# Patient Record
Sex: Female | Born: 1965 | Race: White | Hispanic: No | Marital: Married | Smoking: Never smoker
Health system: Southern US, Community
[De-identification: ages and names within clinical notes are randomized; demographics above are authoritative.]

## PROBLEM LIST (undated history)

## (undated) DIAGNOSIS — I10 Essential (primary) hypertension: Secondary | ICD-10-CM

## (undated) DIAGNOSIS — K5900 Constipation, unspecified: Secondary | ICD-10-CM

## (undated) DIAGNOSIS — K7581 Nonalcoholic steatohepatitis (NASH): Secondary | ICD-10-CM

## (undated) DIAGNOSIS — F419 Anxiety disorder, unspecified: Secondary | ICD-10-CM

## (undated) DIAGNOSIS — E785 Hyperlipidemia, unspecified: Secondary | ICD-10-CM

## (undated) DIAGNOSIS — F32A Depression, unspecified: Secondary | ICD-10-CM

## (undated) DIAGNOSIS — F329 Major depressive disorder, single episode, unspecified: Secondary | ICD-10-CM

## (undated) DIAGNOSIS — G510 Bell's palsy: Secondary | ICD-10-CM

## (undated) HISTORY — DX: Essential (primary) hypertension: I10

## (undated) HISTORY — PX: ABDOMINAL SURGERY: SHX537

## (undated) HISTORY — PX: OTHER SURGICAL HISTORY: SHX169

## (undated) HISTORY — PX: CARPAL TUNNEL RELEASE: SHX101

## (undated) HISTORY — DX: Anxiety disorder, unspecified: F41.9

## (undated) HISTORY — PX: ABDOMINAL HYSTERECTOMY: SHX81

## (undated) HISTORY — DX: Depression, unspecified: F32.A

## (undated) HISTORY — DX: Hyperlipidemia, unspecified: E78.5

## (undated) HISTORY — DX: Nonalcoholic steatohepatitis (NASH): K75.81

## (undated) HISTORY — PX: APPENDECTOMY: SHX54

## (undated) HISTORY — DX: Constipation, unspecified: K59.00

## (undated) HISTORY — PX: COLONOSCOPY: SHX174

## (undated) HISTORY — DX: Bell's palsy: G51.0

## (undated) HISTORY — DX: Major depressive disorder, single episode, unspecified: F32.9

---

## 2000-02-15 ENCOUNTER — Encounter (INDEPENDENT_AMBULATORY_CARE_PROVIDER_SITE_OTHER): Payer: Self-pay | Admitting: Internal Medicine

## 2000-02-15 LAB — CONVERTED CEMR LAB: Pap Smear: NORMAL

## 2002-04-16 HISTORY — PX: ESOPHAGOGASTRODUODENOSCOPY: SHX1529

## 2003-01-04 LAB — HM COLONOSCOPY: HM Colonoscopy: NORMAL

## 2003-01-20 ENCOUNTER — Encounter: Admission: RE | Admit: 2003-01-20 | Discharge: 2003-01-20 | Payer: Self-pay | Admitting: Internal Medicine

## 2003-01-20 ENCOUNTER — Encounter: Payer: Self-pay | Admitting: Internal Medicine

## 2003-01-22 ENCOUNTER — Ambulatory Visit (HOSPITAL_COMMUNITY): Admission: RE | Admit: 2003-01-22 | Discharge: 2003-01-22 | Payer: Self-pay | Admitting: Internal Medicine

## 2003-01-22 ENCOUNTER — Encounter: Payer: Self-pay | Admitting: Internal Medicine

## 2003-01-27 ENCOUNTER — Ambulatory Visit (HOSPITAL_COMMUNITY): Admission: RE | Admit: 2003-01-27 | Discharge: 2003-01-27 | Payer: Self-pay | Admitting: Internal Medicine

## 2004-12-21 ENCOUNTER — Ambulatory Visit: Payer: Self-pay | Admitting: Family Medicine

## 2005-01-16 ENCOUNTER — Ambulatory Visit: Payer: Self-pay | Admitting: Family Medicine

## 2005-04-05 ENCOUNTER — Emergency Department: Payer: Self-pay | Admitting: Emergency Medicine

## 2005-07-06 ENCOUNTER — Ambulatory Visit: Payer: Self-pay | Admitting: Family Medicine

## 2006-01-17 ENCOUNTER — Ambulatory Visit: Payer: Self-pay | Admitting: Family Medicine

## 2006-01-30 ENCOUNTER — Ambulatory Visit: Payer: Self-pay | Admitting: Family Medicine

## 2006-02-21 ENCOUNTER — Ambulatory Visit: Payer: Self-pay | Admitting: Otolaryngology

## 2006-03-11 ENCOUNTER — Ambulatory Visit: Payer: Self-pay | Admitting: Family Medicine

## 2006-04-02 ENCOUNTER — Ambulatory Visit: Payer: Self-pay | Admitting: Internal Medicine

## 2006-04-10 ENCOUNTER — Ambulatory Visit: Payer: Self-pay | Admitting: Otolaryngology

## 2006-04-17 ENCOUNTER — Ambulatory Visit: Payer: Self-pay | Admitting: Family Medicine

## 2006-05-09 ENCOUNTER — Ambulatory Visit: Payer: Self-pay | Admitting: Internal Medicine

## 2006-06-19 ENCOUNTER — Ambulatory Visit: Payer: Self-pay | Admitting: Family Medicine

## 2006-07-16 ENCOUNTER — Ambulatory Visit: Payer: Self-pay | Admitting: Family Medicine

## 2006-07-16 DIAGNOSIS — D239 Other benign neoplasm of skin, unspecified: Secondary | ICD-10-CM

## 2006-07-16 HISTORY — DX: Other benign neoplasm of skin, unspecified: D23.9

## 2006-07-16 LAB — CONVERTED CEMR LAB: Potassium: 3.5 meq/L (ref 3.5–5.1)

## 2006-09-01 ENCOUNTER — Emergency Department: Payer: Self-pay | Admitting: Emergency Medicine

## 2006-11-12 ENCOUNTER — Encounter (INDEPENDENT_AMBULATORY_CARE_PROVIDER_SITE_OTHER): Payer: Self-pay | Admitting: Internal Medicine

## 2006-11-12 DIAGNOSIS — R945 Abnormal results of liver function studies: Secondary | ICD-10-CM | POA: Insufficient documentation

## 2006-11-12 DIAGNOSIS — K589 Irritable bowel syndrome without diarrhea: Secondary | ICD-10-CM | POA: Insufficient documentation

## 2006-11-12 DIAGNOSIS — E669 Obesity, unspecified: Secondary | ICD-10-CM | POA: Insufficient documentation

## 2006-11-12 DIAGNOSIS — G2581 Restless legs syndrome: Secondary | ICD-10-CM | POA: Insufficient documentation

## 2006-11-12 DIAGNOSIS — G4700 Insomnia, unspecified: Secondary | ICD-10-CM | POA: Insufficient documentation

## 2006-11-12 DIAGNOSIS — R319 Hematuria, unspecified: Secondary | ICD-10-CM | POA: Insufficient documentation

## 2006-11-12 DIAGNOSIS — F329 Major depressive disorder, single episode, unspecified: Secondary | ICD-10-CM | POA: Insufficient documentation

## 2006-11-12 DIAGNOSIS — G43109 Migraine with aura, not intractable, without status migrainosus: Secondary | ICD-10-CM | POA: Insufficient documentation

## 2006-11-21 ENCOUNTER — Ambulatory Visit: Payer: Self-pay | Admitting: Internal Medicine

## 2006-11-21 DIAGNOSIS — F411 Generalized anxiety disorder: Secondary | ICD-10-CM | POA: Insufficient documentation

## 2006-11-21 DIAGNOSIS — M542 Cervicalgia: Secondary | ICD-10-CM | POA: Insufficient documentation

## 2007-01-10 ENCOUNTER — Telehealth (INDEPENDENT_AMBULATORY_CARE_PROVIDER_SITE_OTHER): Payer: Self-pay | Admitting: Internal Medicine

## 2007-01-28 ENCOUNTER — Ambulatory Visit: Payer: Self-pay | Admitting: Family Medicine

## 2007-01-28 DIAGNOSIS — S335XXA Sprain of ligaments of lumbar spine, initial encounter: Secondary | ICD-10-CM | POA: Insufficient documentation

## 2008-02-25 ENCOUNTER — Emergency Department: Payer: Self-pay | Admitting: Emergency Medicine

## 2008-03-26 ENCOUNTER — Ambulatory Visit: Payer: Self-pay | Admitting: Unknown Physician Specialty

## 2008-04-02 ENCOUNTER — Ambulatory Visit: Payer: Self-pay | Admitting: Unknown Physician Specialty

## 2008-04-05 ENCOUNTER — Ambulatory Visit: Payer: Self-pay

## 2008-07-28 ENCOUNTER — Ambulatory Visit: Payer: Self-pay | Admitting: Family

## 2008-08-09 ENCOUNTER — Ambulatory Visit: Payer: Self-pay | Admitting: Family

## 2008-10-10 ENCOUNTER — Emergency Department: Payer: Self-pay | Admitting: Emergency Medicine

## 2008-11-15 ENCOUNTER — Ambulatory Visit: Payer: Self-pay | Admitting: Unknown Physician Specialty

## 2009-04-12 ENCOUNTER — Ambulatory Visit: Payer: Self-pay

## 2009-04-16 ENCOUNTER — Ambulatory Visit: Payer: Self-pay

## 2009-05-17 ENCOUNTER — Ambulatory Visit: Payer: Self-pay

## 2009-06-14 ENCOUNTER — Ambulatory Visit: Payer: Self-pay

## 2009-07-15 ENCOUNTER — Ambulatory Visit: Payer: Self-pay

## 2009-08-14 ENCOUNTER — Ambulatory Visit: Payer: Self-pay

## 2009-09-14 ENCOUNTER — Ambulatory Visit: Payer: Self-pay

## 2009-10-19 ENCOUNTER — Ambulatory Visit: Payer: Self-pay | Admitting: Internal Medicine

## 2009-11-01 ENCOUNTER — Ambulatory Visit: Payer: Self-pay | Admitting: Internal Medicine

## 2009-11-10 ENCOUNTER — Ambulatory Visit: Payer: Self-pay | Admitting: General Practice

## 2010-03-22 LAB — PROTIME-INR

## 2010-05-01 ENCOUNTER — Ambulatory Visit: Payer: Self-pay | Admitting: Urology

## 2010-05-14 LAB — CONVERTED CEMR LAB
ALT: 127 units/L — ABNORMAL HIGH (ref 0–35)
BUN: 7 mg/dL (ref 6–23)
CO2: 32 meq/L (ref 19–32)
Calcium: 9.2 mg/dL (ref 8.4–10.5)
Chloride: 103 meq/L (ref 96–112)
Creatinine, Ser: 0.6 mg/dL (ref 0.4–1.2)
Free T4: 0.7 ng/dL (ref 0.6–1.6)
GFR calc Af Amer: 142 mL/min
GFR calc non Af Amer: 117 mL/min
Glucose, Bld: 105 mg/dL — ABNORMAL HIGH (ref 70–99)
Potassium: 3.6 meq/L (ref 3.5–5.1)
Sodium: 142 meq/L (ref 135–145)
T3 Uptake Ratio: 34 % (ref 22.5–37.0)
T3, Free: 2.7 pg/mL (ref 2.3–4.2)
TSH: 0.96 microintl units/mL (ref 0.35–5.50)

## 2010-09-01 NOTE — Assessment & Plan Note (Signed)
[facility]                         GASTROENTEROLOGY OFFICE NOTE   NAME:SATTERFIELDMarylen, Spencer                    MRN:          [id number]  DATE:04/02/2006                            DOB:          [birth year]    CHIEF COMPLAINT:  Constipation and reflux.   ASSESSMENT:  45 year old white woman with:  1. Chronic constipation problems.  They had improved, but now have      returned.  Previous colonoscopy has been unrevealing, and she is      suspected of a motility disorder plus/minus some adhesion problems.  2. Reflux problems with heartburn, not responding completely to      Prevacid 30 mg daily.   RECOMMENDATIONS AND PLAN:  1. Switch Prevacid to Zegerid 40 mg daily, samples given.  2. Add Miralax 1 or 2 doses a day, perhaps priming the pump with 4      doses a day in 1 day as well.  3. She continue her Amitiza every other day.  4. Watch for some change in bowel habits with recently instituted      Augmentin.  5. Recheck CBC because of history of anemia and chronic iron therapy.      She may be able to discontinue her iron.  I have also checked a TSH      and a BMET, given her constipation problems.   I will see her back in 1 month.   HISTORY:  [age]-year-old white woman that I know from previous  problems with constipation and reflux.  Colonoscopy January 27, 2003,  was normal.  EGD was normal January 27, 2003.  She subsequently was  found to have an ovarian cyst which was removed with a left  oophorectomy, and she improved in a lot of ways.  Over the past 9 months  she has had worsening constipation with a lot of bloating and defecation  about twice a week with incomplete defecation.  No bleeding.  In  addition she has developed indigestion and heartburn and some chest  fluttering at times.  She has gained from 157 to 200 pounds in the past  3 years.   MEDICATIONS:  1. Effexor 225 mg daily.  2. Ritalin twice daily.  3. Vitamin C daily.  4. Biotin daily.  5. Iron daily.  6. Amitiza every other day (every day caused diarrhea).  7. Ketoprofen p.r.n.  8. Imitrex p.r.n.  9. Augmentin, just started.  10.Melatonin nightly.  11.Requip nightly.  12.Prevacid 30 mg daily (Solutabs).   DRUG ALLERGIES:  SULFA.   PAST MEDICAL HISTORY:  1. Hypertension.  2. Ovarian cyst.  3. Hysterectomy with adhesiolysis.  4. Appendectomy.  5. Nonalcoholic steatohepatitis.  6. Anxiety and panic disorder and depression.  7. Chronic headaches.  8. Two negative sleep studies.  9. Abnormal nevi.  10.Recent ENT evaluation showed a node in her neck, question mass.      She was started on Augmentin and has a CT pending.   FAMILY HISTORY:  Heart disease in multiple relatives, diabetes in  multiple relatives, parents have had colon polyps.   SOCIAL HISTORY:  She is married, she is  an Optometrist.  No children.   SOCIAL HISTORY:  Alcohol, no tobacco.   REVIEW OF SYSTEMS:  As above.  She has had some fever, sweats,  palpitations with the chest fluttering.  She is having difficulty  staying asleep lately.  There is joint pain, fatigue, some sore throat,  back pain, itching and muscle pains, and she feels forgetful.   All other systems are negative.   PHYSICAL EXAMINATION:  Reveals an obese white woman in no acute  distress.  Height 5 feet 5 inches, weight 200 pounds.  Blood pressure 130/102,  pulse 80.  EYES:  Anicteric.  ENT:  Normal mouth, nose, pharynx.  NECK:  Supple, no mass.  CHEST:  Clear.  HEART:  S1, S2, no murmurs or gallops.  ABDOMEN:  Mildly tender in the right lower quadrant without mass.  LYMPHATIC:  No neck or supraclavicular nodes.  Note there is a tiny left  submandibular node.  I do not palpate a mass.  EXTREMITIES:  No peripheral edema.  SKIN:  No rash.  NEUROLOGIC:  She is alert and oriented x3.   Note her blood pressure was mildly elevated, that is a possible side  effect of Effexor.  This will be followed up on her  next visit.     Gatha Mayer, MD,FACG  Electronically Signed    CEG/MedQ  DD: 04/02/2006  DT: 04/03/2006  Job #: [id number]   cc:   Dalene Seltzer D. Bean, FNP

## 2010-09-01 NOTE — Assessment & Plan Note (Signed)
[name]                         GASTROENTEROLOGY OFFICE NOTE   NAME:[name], Spencer                    MRN:          [id number]  DATE:05/09/2006                            DOB:          1965/06/18    PROBLEMS:  1. Constipation, predominant irritable bowel syndrome, which is much      improved using Amitiza every other day and MiraLax twice a week.      She has stopped her iron.  2. Previous anemia.  Hemoglobin normal in December.  3. Hypokalemia.  She was supplemented with 20 mEq a day with a      potassium of 2.9, and her potassium was 3.8 after 2 weeks of that.  4. Gastroesophageal reflux disease.  Switch from Prevacid to Zegerid      has worked well.  She has no heartburn, however, Zegerid is non-      formulary, so I have prescribed and sampled Nexium 40 mg daily.   Other symptoms; she has had a little bit of lack of energy.  Her weight is 200 pounds.  Pulse 72.  Blood pressure 118/78.   PAST MEDICAL HISTORY:  Reviewed and unchanged from note of April 02, 2006.   MEDICATIONS:  Listed and reviewed in the chart.   ASSESSMENT:  As per problem list above.   PLAN:  Continue medications as described above.  Return to see me as  needed.  We will recheck her potassium in 1 month.  We will stop her  potassium now.  If she has persistent hypokalemia, then may need chronic  supplementation, though she is not on any diuretics or anything that I  am aware of that should precipitate hypokalemia.     Gatha Mayer, MD,FACG  Electronically Signed    CEG/MedQ  DD: 05/09/2006  DT: 05/09/2006  Job #: [id number]   cc:   [name], FNP

## 2010-09-07 ENCOUNTER — Ambulatory Visit: Payer: Self-pay | Admitting: Internal Medicine

## 2010-09-14 ENCOUNTER — Ambulatory Visit: Payer: Self-pay | Admitting: Internal Medicine

## 2010-11-21 ENCOUNTER — Encounter: Payer: Self-pay | Admitting: Internal Medicine

## 2010-12-01 ENCOUNTER — Ambulatory Visit: Payer: Self-pay | Admitting: Internal Medicine

## 2010-12-08 ENCOUNTER — Ambulatory Visit: Payer: Self-pay | Admitting: Internal Medicine

## 2010-12-11 ENCOUNTER — Emergency Department: Payer: Self-pay | Admitting: Emergency Medicine

## 2010-12-15 ENCOUNTER — Encounter: Payer: Self-pay | Admitting: Internal Medicine

## 2010-12-16 ENCOUNTER — Ambulatory Visit: Payer: Self-pay | Admitting: Internal Medicine

## 2010-12-29 ENCOUNTER — Encounter: Payer: Self-pay | Admitting: Internal Medicine

## 2011-01-09 ENCOUNTER — Telehealth: Payer: Self-pay | Admitting: Internal Medicine

## 2011-01-09 NOTE — Telephone Encounter (Signed)
I think she has an appointment this week. Can you see if we can discuss at her visit?

## 2011-01-09 NOTE — Telephone Encounter (Signed)
DR Gilford Rile TO CALL PATIENT ABOUT REFERRAL  WITH PSYHCOLOGIST  PT DID SIGNED MEDICAL RELEASE FORM AT [facility]

## 2011-01-11 NOTE — Telephone Encounter (Signed)
Please set up appointmnet.

## 2011-01-11 NOTE — Telephone Encounter (Signed)
She doesn't have an appointment scheduled.  Please advise.

## 2011-01-12 NOTE — Telephone Encounter (Signed)
Robin please call patient and make an appt. For her. thanks

## 2011-01-12 NOTE — Telephone Encounter (Signed)
Robin please call patient and make an appt. For her.Thanks

## 2011-01-17 ENCOUNTER — Telehealth: Payer: Self-pay | Admitting: Internal Medicine

## 2011-01-17 NOTE — Telephone Encounter (Signed)
Left message for pt to call office 10/3

## 2011-01-19 NOTE — Telephone Encounter (Signed)
Left message on cell phone for pt to call office 10/5/rbh

## 2011-01-19 NOTE — Telephone Encounter (Signed)
Appointment made for 10/19

## 2011-01-26 ENCOUNTER — Ambulatory Visit: Payer: Self-pay | Admitting: Internal Medicine

## 2011-02-02 ENCOUNTER — Ambulatory Visit: Payer: Self-pay | Admitting: Internal Medicine

## 2011-02-05 ENCOUNTER — Ambulatory Visit: Payer: Self-pay | Admitting: Internal Medicine

## 2011-02-05 ENCOUNTER — Encounter: Payer: Self-pay | Admitting: Internal Medicine

## 2011-02-14 ENCOUNTER — Ambulatory Visit (INDEPENDENT_AMBULATORY_CARE_PROVIDER_SITE_OTHER): Payer: BC Managed Care – PPO | Admitting: Internal Medicine

## 2011-02-14 ENCOUNTER — Encounter: Payer: Self-pay | Admitting: Internal Medicine

## 2011-02-14 VITALS — BP 129/64 | HR 57 | Temp 97.2°F | Resp 16 | Ht 66.0 in | Wt 144.0 lb

## 2011-02-14 DIAGNOSIS — F341 Dysthymic disorder: Secondary | ICD-10-CM

## 2011-02-14 DIAGNOSIS — F32A Depression, unspecified: Secondary | ICD-10-CM

## 2011-02-14 DIAGNOSIS — K7581 Nonalcoholic steatohepatitis (NASH): Secondary | ICD-10-CM

## 2011-02-14 DIAGNOSIS — F329 Major depressive disorder, single episode, unspecified: Secondary | ICD-10-CM

## 2011-02-14 DIAGNOSIS — K7689 Other specified diseases of liver: Secondary | ICD-10-CM

## 2011-02-14 DIAGNOSIS — F418 Other specified anxiety disorders: Secondary | ICD-10-CM | POA: Insufficient documentation

## 2011-02-14 DIAGNOSIS — K5900 Constipation, unspecified: Secondary | ICD-10-CM | POA: Insufficient documentation

## 2011-02-14 NOTE — Patient Instructions (Signed)
Miralax 1 capful in glass of juice as needed for constipation

## 2011-02-14 NOTE — Progress Notes (Signed)
Subjective:    Patient ID: Jessica Spencer, female    DOB: 04-16-1966, 45 y.o.   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 She reports that she has been physically feeling well. She continues on a very healthy low fat low carbohydrate diet with moderate exercise in an effort to control her fatty liver disease. She reports that she has scheduled followup with her liver specialist next month. She has not noted any abdominal distention, easy bleeding or bruising, or other complaints. She does have a history of chronic constipation and occasionally has to strain to have a bowel movement. She has tried increasing her fiber and fluid intake with minimal improvement. She denies any blood in her stool or black tarry stools.  She notes recent increase in her anxiety and depression. She attributes this to her father's recent diagnosis of lymphoma. She has sought out counseling for this and is seeing a counselor once a week. She is not interested in starting medications to help with her symptoms.  Outpatient Encounter Prescriptions as of 02/14/2011  Medication Sig Dispense Refill  . cholecalciferol (VITAMIN D) 1000 UNITS tablet Take 1,000 Units by mouth 2 (two) times daily.        . vitamin E 800 UNIT capsule Take 800 Units by mouth daily.          Review of Systems  Constitutional: Negative for fever, chills, appetite change, fatigue and unexpected weight change.  HENT: Negative for ear pain, congestion, sore throat, trouble swallowing, neck pain, voice change and sinus pressure.   Eyes: Negative for visual disturbance.  Respiratory: Negative for cough, shortness of breath, wheezing and stridor.   Cardiovascular: Negative for chest pain, palpitations and leg swelling.  Gastrointestinal: Positive for constipation. Negative for nausea, vomiting, abdominal pain, diarrhea, blood in stool, abdominal distention and anal bleeding.  Genitourinary: Negative for dysuria  and flank pain.  Musculoskeletal: Negative for myalgias, arthralgias and gait problem.  Skin: Negative for color change and rash.  Neurological: Negative for dizziness and headaches.  Hematological: Negative for adenopathy. Does not bruise/bleed easily.  Psychiatric/Behavioral: Positive for dysphoric mood. Negative for suicidal ideas and sleep disturbance. The patient is nervous/anxious.    BP 129/64  Pulse 57  Temp(Src) 97.2 F (36.2 C) (Oral)  Resp 16  Ht 5' 6"  (1.676 m)  Wt 144 lb (65.318 kg)  BMI 23.24 kg/m2  SpO2 98%     Objective:   Physical Exam  Constitutional: She is oriented to person, place, and time. She appears well-developed and well-nourished. No distress.  HENT:  Head: Normocephalic and atraumatic.  Right Ear: External ear normal.  Left Ear: External ear normal.  Nose: Nose normal.  Mouth/Throat: Oropharynx is clear and moist. No oropharyngeal exudate.  Eyes: Conjunctivae are normal. Pupils are equal, round, and reactive to light. Right eye exhibits no discharge. Left eye exhibits no discharge. No scleral icterus.  Neck: Normal range of motion. Neck supple. No tracheal deviation present. No thyromegaly present.  Cardiovascular: Normal rate, regular rhythm, normal heart sounds and intact distal pulses.  Exam reveals no gallop and no friction rub.   No murmur heard. Pulmonary/Chest: Effort normal and breath sounds normal. No respiratory distress. She has no wheezes. She has no rales. She exhibits no tenderness.  Musculoskeletal: Normal range of motion. She exhibits no edema and no tenderness.  Lymphadenopathy:    She has no cervical adenopathy.  Neurological: She is alert and oriented to person, place, and time. No  cranial nerve deficit. She exhibits normal muscle tone. Coordination normal.  Skin: Skin is warm and dry. No rash noted. She is not diaphoretic. No erythema. No pallor.  Psychiatric: She has a normal mood and affect. Her behavior is normal. Judgment and  thought content normal.          Assessment & Plan:  1. Chronic Liver Disease/NASH -recent liver function test have been stable. She has followup with her liver specialist next month and we will obtain records from this visit. Encouraged her to continue with healthy diet and exercise. She will followup here in 6 months.  2. Anxiety/Depression -recent exacerbation of anxiety and depression in the setting of father's diagnosis of malignancy. Offered support today. Encouraged her to continue with counseling. She would prefer not to start medications at this time. We'll have her followup here as needed or in 6 months.  3. Constipation -patient with history of chronic constipation. Encouraged her to continue to increase fiber and fluids in her diet to help with this. Also recommended use of MiraLax one capful mixed in fluid twice daily. She will call or return to clinic if symptoms are not improving.

## 2011-02-15 ENCOUNTER — Ambulatory Visit: Payer: Self-pay | Admitting: Internal Medicine

## 2011-03-15 ENCOUNTER — Telehealth: Payer: Self-pay | Admitting: Internal Medicine

## 2011-03-15 NOTE — Telephone Encounter (Signed)
Spoke w/pt - she c/o sinus congestion and neck pain. Tx by UC w/abx and pain meds w/no relief. Advised that she would need OV for eval and scheduled for apt tomorrow am

## 2011-03-15 NOTE — Telephone Encounter (Signed)
Patient left a message earlier on your voice mail would like a response today.

## 2011-03-16 ENCOUNTER — Encounter: Payer: Self-pay | Admitting: Internal Medicine

## 2011-03-16 ENCOUNTER — Ambulatory Visit (INDEPENDENT_AMBULATORY_CARE_PROVIDER_SITE_OTHER): Payer: BC Managed Care – PPO | Admitting: Internal Medicine

## 2011-03-16 VITALS — BP 128/78 | HR 53 | Temp 98.0°F | Wt 148.0 lb

## 2011-03-16 DIAGNOSIS — M542 Cervicalgia: Secondary | ICD-10-CM

## 2011-03-16 DIAGNOSIS — R490 Dysphonia: Secondary | ICD-10-CM

## 2011-03-16 NOTE — Patient Instructions (Signed)
Evaluation by ENT today.

## 2011-03-16 NOTE — Progress Notes (Signed)
Subjective:    Patient ID: Jessica Spencer, female    DOB: 24-Oct-1965, 45 y.o.   MRN: [id number]  HPI 45YO female presents for acute visit c/o 2 weeks of anterior and right lateral neck pain and 4 day h/o hoarse voice. Was seen at urgent care last Saturday and diagnosed as possible ear infection, treated with Augmentin and Tramadol for pain.  Reports no improvement with this. Denies fever, chills, sore throat, cough, nasal congestion, malaise. Denies any mass or swelling of her neck. Has full range of movement of her neck.  Outpatient Encounter Prescriptions as of 03/16/2011  Medication Sig Dispense Refill  . traMADol (ULTRAM) 50 MG tablet Take 50 mg by mouth every 6 (six) hours as needed. Maximum dose= 8 tablets per day       . cholecalciferol (VITAMIN D) 1000 UNITS tablet Take 1,000 Units by mouth 2 (two) times daily.        . vitamin E 800 UNIT capsule Take 800 Units by mouth daily.          Review of Systems  Constitutional: Negative for fever, chills and fatigue.  HENT: Positive for neck pain and voice change. Negative for hearing loss, ear pain, congestion, sore throat, facial swelling, rhinorrhea, sneezing, drooling, mouth sores, trouble swallowing, neck stiffness, dental problem, postnasal drip, sinus pressure and ear discharge.   Eyes: Negative for visual disturbance.  Respiratory: Negative for cough and shortness of breath.   Cardiovascular: Negative for chest pain.  Hematological: Negative for adenopathy.       Objective:   Physical Exam  Constitutional: She is oriented to person, place, and time. She appears well-developed and well-nourished. No distress.  HENT:  Head: Normocephalic and atraumatic.    Right Ear: External ear normal. Tympanic membrane is not erythematous. A middle ear effusion is present.  Left Ear: External ear normal. Tympanic membrane is not erythematous. A middle ear effusion is present.  Nose: Nose normal.  Mouth/Throat: Oropharynx is clear and  moist. No oropharyngeal exudate or posterior oropharyngeal erythema.  Eyes: Conjunctivae are normal. Pupils are equal, round, and reactive to light. Right eye exhibits no discharge. Left eye exhibits no discharge. No scleral icterus.  Neck: Normal range of motion. Neck supple. No tracheal deviation present. No thyromegaly present.  Cardiovascular: Normal rate, regular rhythm, normal heart sounds and intact distal pulses.  Exam reveals no gallop and no friction rub.   No murmur heard. Pulmonary/Chest: Effort normal and breath sounds normal. No respiratory distress. She has no wheezes. She has no rales. She exhibits no tenderness.  Musculoskeletal: Normal range of motion. She exhibits no edema and no tenderness.  Lymphadenopathy:    She has no cervical adenopathy.       Right: No supraclavicular adenopathy present.       Left: No supraclavicular adenopathy present.  Neurological: She is alert and oriented to person, place, and time. No cranial nerve deficit. She exhibits normal muscle tone. Coordination normal.  Skin: Skin is warm and dry. No rash noted. She is not diaphoretic. No erythema. No pallor.  Psychiatric: She has a normal mood and affect. Her behavior is normal. Judgment and thought content normal.          Assessment & Plan:  1. Neck pain and hoarseness - Most likely viral laryngitis, however, no viral symptoms to suggest this such as fever, cough, congestion. Question if she may have paralysis of vocal cord or vocal cord abscess. Called and set pt up for ENT evaluation today.  Question if prednisone might be helpful.

## 2011-05-02 ENCOUNTER — Ambulatory Visit: Payer: Self-pay | Admitting: Urology

## 2011-05-22 ENCOUNTER — Encounter: Payer: Self-pay | Admitting: Internal Medicine

## 2011-05-25 ENCOUNTER — Telehealth: Payer: Self-pay | Admitting: Internal Medicine

## 2011-05-25 NOTE — Telephone Encounter (Signed)
Will need to be seen in a visit.

## 2011-05-28 NOTE — Telephone Encounter (Signed)
Left mess to call office back and make an apt.

## 2011-05-30 NOTE — Telephone Encounter (Signed)
Left VM to call office back.

## 2011-07-06 ENCOUNTER — Ambulatory Visit (INDEPENDENT_AMBULATORY_CARE_PROVIDER_SITE_OTHER): Payer: BC Managed Care – PPO | Admitting: Internal Medicine

## 2011-07-06 ENCOUNTER — Encounter: Payer: Self-pay | Admitting: Internal Medicine

## 2011-07-06 VITALS — BP 128/70 | HR 60 | Temp 97.5°F | Ht 66.0 in | Wt 150.0 lb

## 2011-07-06 DIAGNOSIS — K7581 Nonalcoholic steatohepatitis (NASH): Secondary | ICD-10-CM

## 2011-07-06 DIAGNOSIS — K7689 Other specified diseases of liver: Secondary | ICD-10-CM

## 2011-07-06 DIAGNOSIS — K5900 Constipation, unspecified: Secondary | ICD-10-CM

## 2011-07-06 DIAGNOSIS — M21612 Bunion of left foot: Secondary | ICD-10-CM

## 2011-07-06 DIAGNOSIS — M21619 Bunion of unspecified foot: Secondary | ICD-10-CM

## 2011-07-06 DIAGNOSIS — F341 Dysthymic disorder: Secondary | ICD-10-CM

## 2011-07-06 DIAGNOSIS — F32A Depression, unspecified: Secondary | ICD-10-CM

## 2011-07-06 DIAGNOSIS — F329 Major depressive disorder, single episode, unspecified: Secondary | ICD-10-CM

## 2011-07-06 NOTE — Assessment & Plan Note (Signed)
Will set up with podiatry for evaluation for orthotics.

## 2011-07-06 NOTE — Assessment & Plan Note (Signed)
Symptoms reasonably well controlled without medication.  Will continue to follow.

## 2011-07-06 NOTE — Progress Notes (Signed)
Subjective:    Patient ID: Jessica Spencer, female    DOB: Feb 27, 1966, 46 y.o.   MRN: [id number]  HPI 46YO female with h/o NASH, anxiety presents for follow up.  She reports she is generally doing well. She continues to have some chronic constipation. She has been using MiraLax with minimal improvement. She notes that she occasionally goes up to a week without a bowel movement. When she has a bowel movement it is typically a small pedals. She denies any blood in her stool.  In regards to her history of NASH, she notes she was recently seen at [facility] by her hepatologist. She reports that liver function test were stable.  In regards to history of anxiety, she notes continued anxiety with caring for her father. This is currently controlled without use of medications.  She is also concerned today about swelling over the joint on her great toe on her left foot. She questions whether this might be a bunion. She notes pain in the area particularly with prolonged walking.  Outpatient Encounter Prescriptions as of 07/06/2011  Medication Sig Dispense Refill  . cholecalciferol (VITAMIN D) 1000 UNITS tablet Take 1,000 Units by mouth 2 (two) times daily.        . vitamin E 800 UNIT capsule Take 800 Units by mouth daily.        Marland Kitchen DISCONTD: traMADol (ULTRAM) 50 MG tablet Take 50 mg by mouth every 6 (six) hours as needed. Maximum dose= 8 tablets per day         Review of Systems  Constitutional: Negative for fever, chills, appetite change, fatigue and unexpected weight change.  HENT: Negative for ear pain, congestion, sore throat, trouble swallowing, neck pain, voice change and sinus pressure.   Eyes: Negative for visual disturbance.  Respiratory: Negative for cough, shortness of breath, wheezing and stridor.   Cardiovascular: Negative for chest pain, palpitations and leg swelling.  Gastrointestinal: Positive for constipation. Negative for nausea, vomiting, abdominal pain, diarrhea, blood in stool, abdominal  distention and anal bleeding.  Genitourinary: Negative for dysuria and flank pain.  Musculoskeletal: Positive for joint swelling. Negative for myalgias, arthralgias and gait problem.  Skin: Negative for color change and rash.  Neurological: Negative for dizziness and headaches.  Hematological: Negative for adenopathy. Does not bruise/bleed easily.  Psychiatric/Behavioral: Negative for suicidal ideas, sleep disturbance and dysphoric mood. The patient is nervous/anxious.    BP 128/70  Pulse 60  Temp(Src) 97.5 F (36.4 C) (Oral)  Ht 5' 6"  (1.676 m)  Wt 150 lb (68.04 kg)  BMI 24.21 kg/m2  SpO2 100%     Objective:   Physical Exam  Constitutional: She is oriented to person, place, and time. She appears well-developed and well-nourished. No distress.  HENT:  Head: Normocephalic and atraumatic.  Right Ear: External ear normal.  Left Ear: External ear normal.  Nose: Nose normal.  Mouth/Throat: Oropharynx is clear and moist. No oropharyngeal exudate.  Eyes: Conjunctivae are normal. Pupils are equal, round, and reactive to light. Right eye exhibits no discharge. Left eye exhibits no discharge. No scleral icterus.  Neck: Normal range of motion. Neck supple. No tracheal deviation present. No thyromegaly present.  Cardiovascular: Normal rate, regular rhythm, normal heart sounds and intact distal pulses.  Exam reveals no gallop and no friction rub.   No murmur heard. Pulmonary/Chest: Effort normal and breath sounds normal. No respiratory distress. She has no wheezes. She has no rales. She exhibits no tenderness.  Abdominal: Soft. Bowel sounds are normal. She exhibits no  distension. There is no hepatosplenomegaly. There is no tenderness.  Musculoskeletal: Normal range of motion. She exhibits no edema and no tenderness.       Feet:  Lymphadenopathy:    She has no cervical adenopathy.  Neurological: She is alert and oriented to person, place, and time. No cranial nerve deficit. She exhibits  normal muscle tone. Coordination normal.  Skin: Skin is warm and dry. No rash noted. She is not diaphoretic. No erythema. No pallor.  Psychiatric: She has a normal mood and affect. Her behavior is normal. Judgment and thought content normal.          Assessment & Plan:

## 2011-07-06 NOTE — Assessment & Plan Note (Signed)
Chronic. Discussed adding probiotic such as Align to help with constipation. Follow up in 6 months and prn.

## 2011-07-06 NOTE — Assessment & Plan Note (Signed)
Stable. Followed at [facility]. Will get records on last evaluation in 03/2011.  Follow up here with repeat CMP in 6 months.

## 2011-11-09 ENCOUNTER — Telehealth: Payer: Self-pay | Admitting: Internal Medicine

## 2011-11-09 DIAGNOSIS — Z1231 Encounter for screening mammogram for malignant neoplasm of breast: Secondary | ICD-10-CM

## 2011-11-09 NOTE — Telephone Encounter (Signed)
Pt received letter from [facility] stating its time for mammogram    Need order Pt perfer am appointment

## 2011-11-09 NOTE — Telephone Encounter (Signed)
Order entered for screening mammogram, patient will wait to hear from [facility] to schedule mammogram.

## 2011-12-11 ENCOUNTER — Ambulatory Visit: Payer: Self-pay | Admitting: Internal Medicine

## 2011-12-19 ENCOUNTER — Encounter: Payer: Self-pay | Admitting: Internal Medicine

## 2011-12-24 ENCOUNTER — Ambulatory Visit (INDEPENDENT_AMBULATORY_CARE_PROVIDER_SITE_OTHER): Payer: BC Managed Care – PPO | Admitting: Internal Medicine

## 2011-12-24 ENCOUNTER — Encounter: Payer: Self-pay | Admitting: Internal Medicine

## 2011-12-24 VITALS — BP 130/74 | HR 76 | Temp 97.7°F | Ht 66.0 in | Wt 144.8 lb

## 2011-12-24 DIAGNOSIS — K645 Perianal venous thrombosis: Secondary | ICD-10-CM

## 2011-12-24 NOTE — Progress Notes (Signed)
  Subjective:    Patient ID: Jessica Spencer, female    DOB: Jan 25, 1966, 46 y.o.   MRN: [id number]  HPI 46 year old female presents for acute visit complaining of severe rectal pain x3 days. She reports that symptoms first started on Saturday. She denies any rectal bleeding. She notes some chronic constipation for which she uses MiraLax. She reports she has been passing small amount of pebble like stool. She denies straining to have a bowel movement. She denies abdominal pain, fever, chills. She has difficulty sitting because of severe rectal pain.  Outpatient Encounter Prescriptions as of 12/24/2011  Medication Sig Dispense Refill  . cholecalciferol (VITAMIN D) 1000 UNITS tablet Take 1,000 Units by mouth 2 (two) times daily.        . vitamin E 800 UNIT capsule Take 800 Units by mouth daily.         BP 130/74  Pulse 76  Temp 97.7 F (36.5 C) (Oral)  Ht 5' 6"  (1.676 m)  Wt 144 lb 12 oz (65.658 kg)  BMI 23.36 kg/m2  SpO2 98%  Review of Systems  Constitutional: Negative for fever, chills and fatigue.  Gastrointestinal: Positive for constipation and rectal pain. Negative for vomiting, abdominal pain, diarrhea, blood in stool, abdominal distention and anal bleeding.       Objective:   Physical Exam  Constitutional: She appears well-developed and well-nourished. No distress.  HENT:  Head: Normocephalic and atraumatic.  Eyes: EOM are normal.  Genitourinary: Rectal exam shows external hemorrhoid and tenderness.     Skin: She is not diaphoretic.          Assessment & Plan:

## 2011-12-24 NOTE — Assessment & Plan Note (Signed)
Large external thrombosed hemorrhoid. Will set up Gen. surgery evaluation for today for possible drainage. We also discussed the importance of increasing use of MiraLax or Colace to help soften stool and prevent constipation. Followup as needed.

## 2012-01-04 ENCOUNTER — Encounter: Payer: Self-pay | Admitting: Internal Medicine

## 2012-01-04 ENCOUNTER — Ambulatory Visit (INDEPENDENT_AMBULATORY_CARE_PROVIDER_SITE_OTHER): Payer: BC Managed Care – PPO | Admitting: Internal Medicine

## 2012-01-04 VITALS — BP 130/80 | HR 60 | Temp 98.2°F | Ht 66.0 in | Wt 146.0 lb

## 2012-01-04 DIAGNOSIS — Z0000 Encounter for general adult medical examination without abnormal findings: Secondary | ICD-10-CM

## 2012-01-04 DIAGNOSIS — K7581 Nonalcoholic steatohepatitis (NASH): Secondary | ICD-10-CM

## 2012-01-04 DIAGNOSIS — K7689 Other specified diseases of liver: Secondary | ICD-10-CM

## 2012-01-04 NOTE — Assessment & Plan Note (Signed)
General medical exam normal today including breast exam. PAP deferred as normal 07/2010.  Mammogram UTD 11/2011 BIRADS1.  Colonoscopy UTD. Will request recent labs from [facility]. Follow up 6 months and prn.

## 2012-01-04 NOTE — Progress Notes (Signed)
Subjective:    Patient ID: Jessica Spencer, female    DOB: [birth year], 46 y.o.   MRN: [id number]  HPI 46 year old female with history of steatohepatitis presents for annual exam. She reports she is doing well. She was recently seen in our office for thrombosed external hemorrhoid and was referred to surgery. She had incision and drainage of the hemorrhoid with improvement in her symptoms. She reports that she has been doing well. She continues to follow a healthy diet and get regular physical activity. In regards to steatohepatitis, she is followed every 6 months at [facility]. She reports that recent liver functions were normal. In regards to health maintenance, she notes a recent mammogram was normal. Pap smear was normal in 2012. Colonoscopy was formed in approximately 2004 it was normal. Immunizations are up-to-date the exception of flu vaccine which she will get her work.  Outpatient Encounter Prescriptions as of 01/04/2012  Medication Sig Dispense Refill  . cholecalciferol (VITAMIN D) 1000 UNITS tablet Take 1,000 Units by mouth 2 (two) times daily.        . vitamin E 800 UNIT capsule Take 800 Units by mouth daily.         BP 130/80  Pulse 60  Temp 98.2 F (36.8 C) (Oral)  Ht 5' 6"  (1.676 m)  Wt 146 lb (66.225 kg)  BMI 23.56 kg/m2  SpO2 97%  Review of Systems  Constitutional: Negative for fever, chills, appetite change, fatigue and unexpected weight change.  HENT: Negative for ear pain, congestion, sore throat, trouble swallowing, neck pain, voice change and sinus pressure.   Eyes: Negative for visual disturbance.  Respiratory: Negative for cough, shortness of breath, wheezing and stridor.   Cardiovascular: Negative for chest pain, palpitations and leg swelling.  Gastrointestinal: Negative for nausea, vomiting, abdominal pain, diarrhea, constipation, blood in stool, abdominal distention and anal bleeding.  Genitourinary: Negative for dysuria and flank pain.  Musculoskeletal:  Negative for myalgias, arthralgias and gait problem.  Skin: Negative for color change and rash.  Neurological: Negative for dizziness and headaches.  Hematological: Negative for adenopathy. Does not bruise/bleed easily.  Psychiatric/Behavioral: Negative for suicidal ideas, disturbed wake/sleep cycle and dysphoric mood. The patient is not nervous/anxious.        Objective:   Physical Exam  Constitutional: She is oriented to person, place, and time. She appears well-developed and well-nourished. No distress.  HENT:  Head: Normocephalic and atraumatic.  Right Ear: External ear normal.  Left Ear: External ear normal.  Nose: Nose normal.  Mouth/Throat: Oropharynx is clear and moist. No oropharyngeal exudate.  Eyes: Conjunctivae normal are normal. Pupils are equal, round, and reactive to light. Right eye exhibits no discharge. Left eye exhibits no discharge. No scleral icterus.  Neck: Normal range of motion. Neck supple. No tracheal deviation present. No thyromegaly present.  Cardiovascular: Normal rate, regular rhythm, normal heart sounds and intact distal pulses.  Exam reveals no gallop and no friction rub.   No murmur heard. Pulmonary/Chest: Effort normal and breath sounds normal. No accessory muscle usage. Not tachypneic. No respiratory distress. She has no decreased breath sounds. She has no wheezes. She has no rhonchi. She has no rales. She exhibits no tenderness. Right breast exhibits no inverted nipple, no mass, no nipple discharge, no skin change and no tenderness. Left breast exhibits no inverted nipple, no mass, no nipple discharge, no skin change and no tenderness. Breasts are symmetrical.  Abdominal: Soft. Bowel sounds are normal. She exhibits no distension and no mass. There is no  tenderness. There is no guarding.  Musculoskeletal: Normal range of motion. She exhibits no edema and no tenderness.  Lymphadenopathy:    She has no cervical adenopathy.  Neurological: She is alert and  oriented to person, place, and time. No cranial nerve deficit. She exhibits normal muscle tone. Coordination normal.  Skin: Skin is warm and dry. No rash noted. She is not diaphoretic. No erythema. No pallor.  Psychiatric: She has a normal mood and affect. Her behavior is normal. Judgment and thought content normal.          Assessment & Plan:

## 2012-01-29 IMAGING — US US RENAL KIDNEY
1 series · 17 of 25 positions shown · non-contrast
Comparison: none

REASON FOR EXAM: left renal cyst and pain
COMMENTS:

[Series 1: us renal kidney · 17 of 44 slices shown]
[im 1/44]
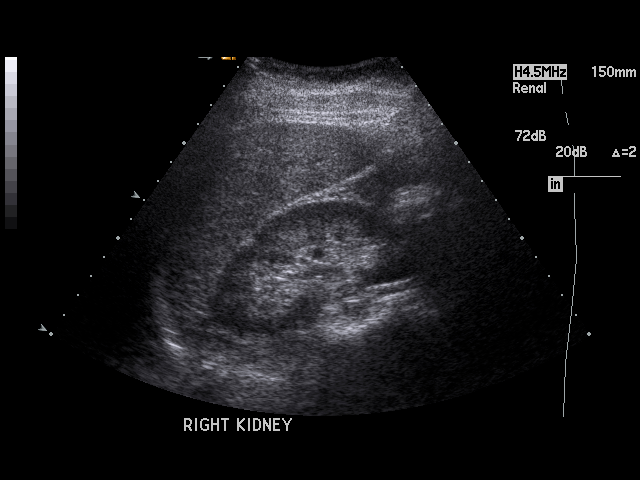
[im 4/44]
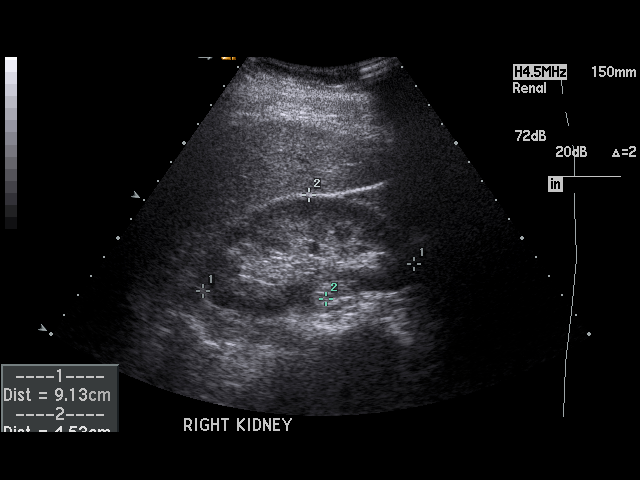
[im 6/44]
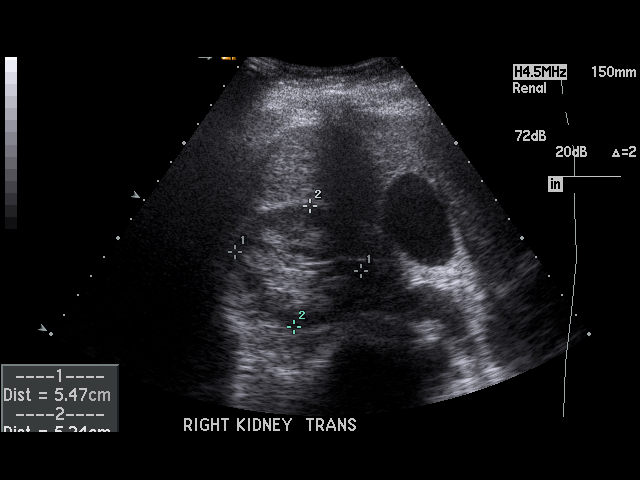
[im 9/44]
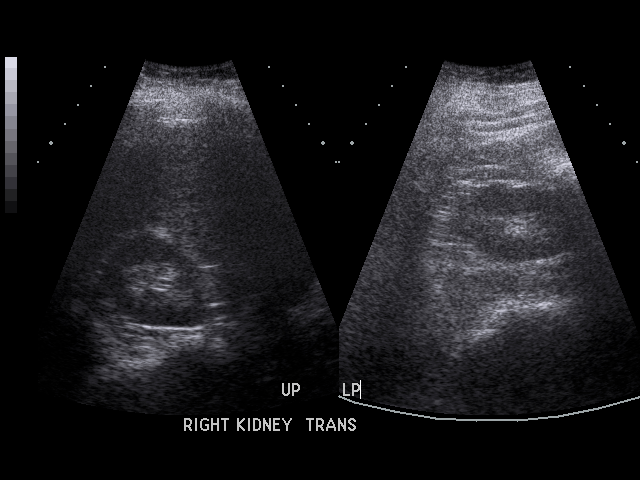
[im 11/44]
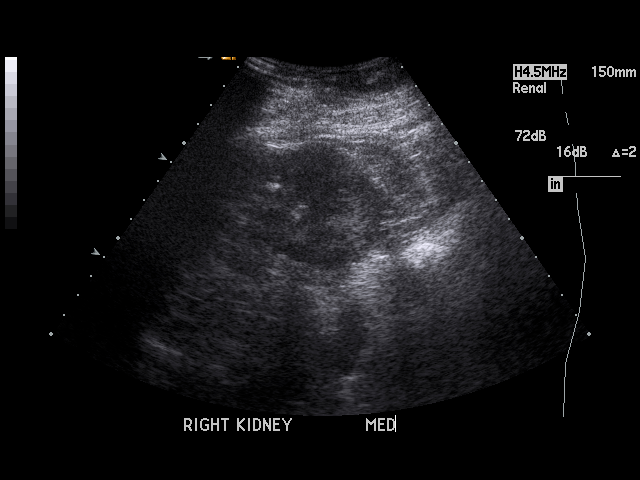
[im 15/44]
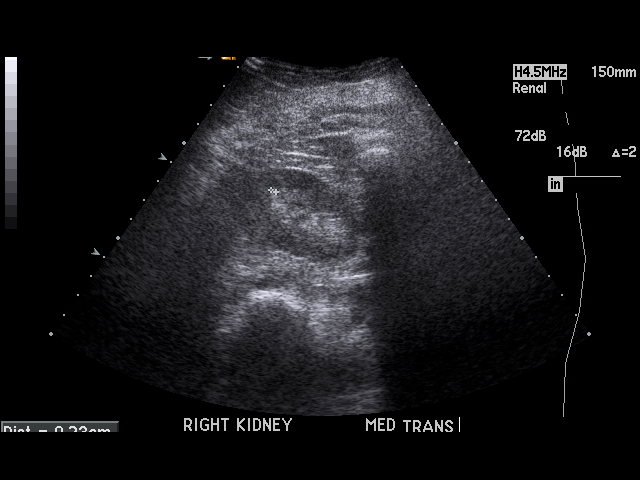
[im 17/44]
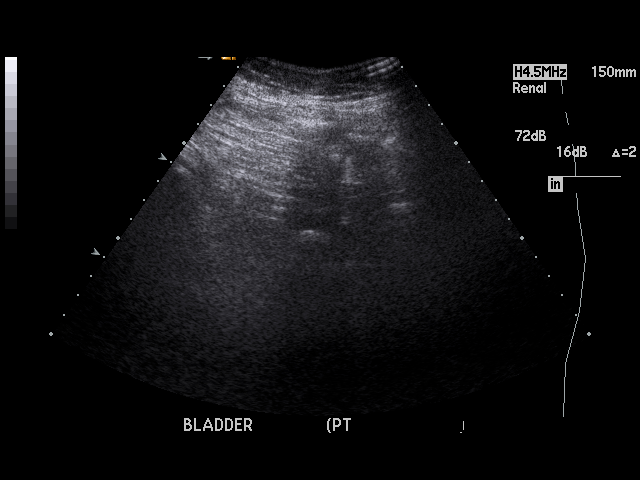
[im 20/44]
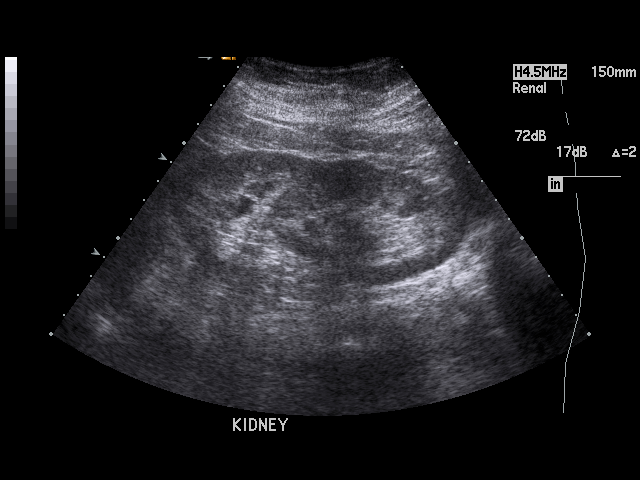
[im 22/44]
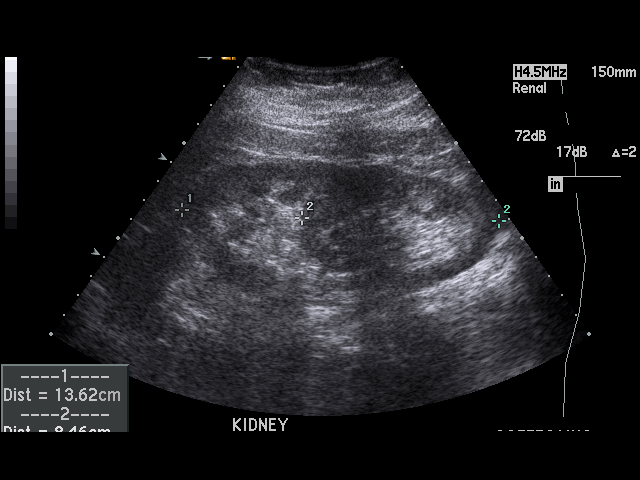
[im 24/44]
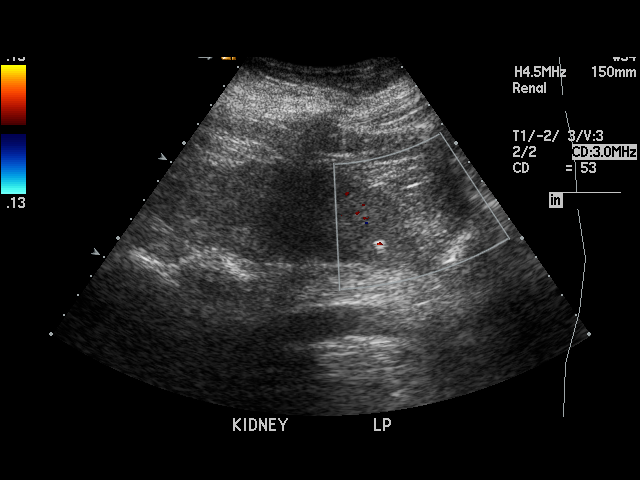
[im 27/44]
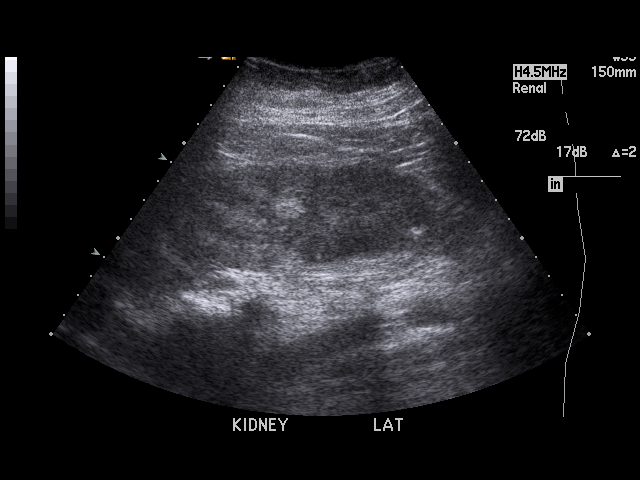
[im 29/44]
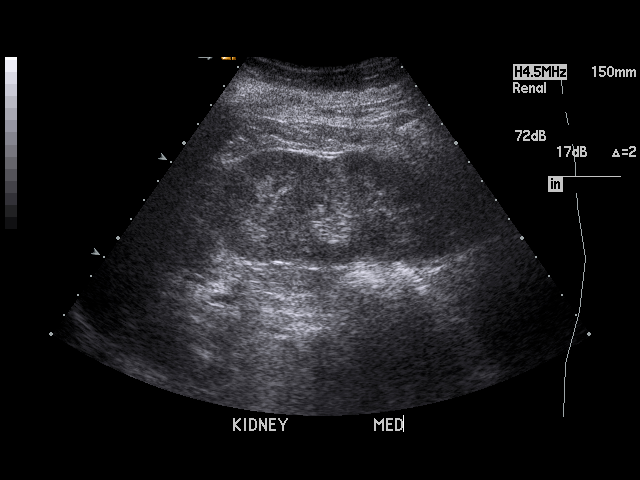
[im 33/44]
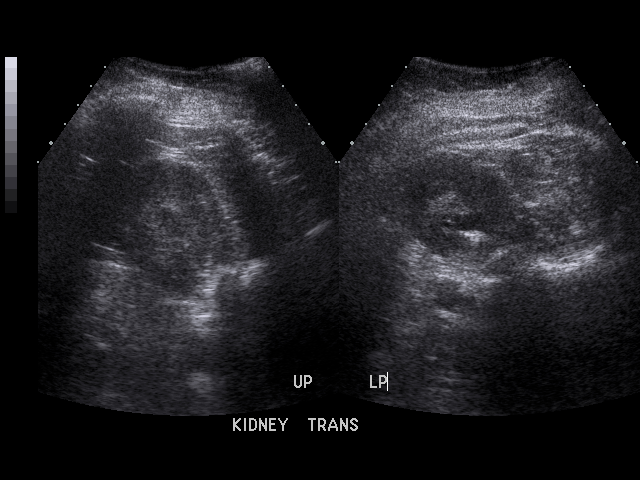
[im 35/44]
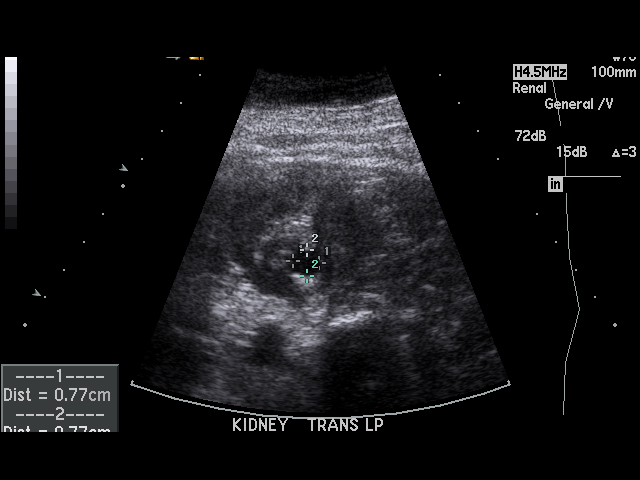
[im 38/44]
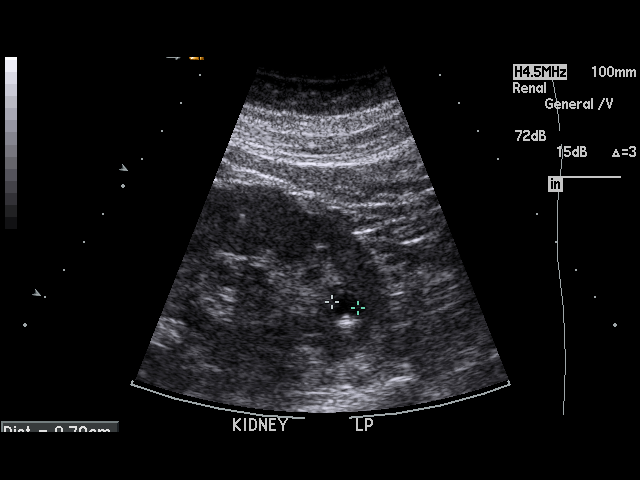
[im 40/44]
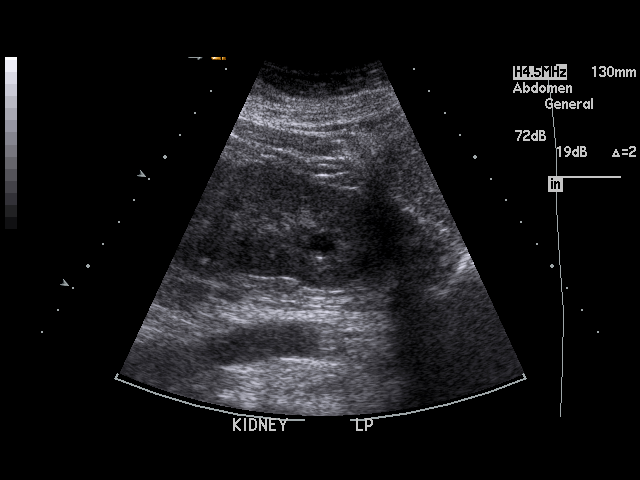
[im 44/44]
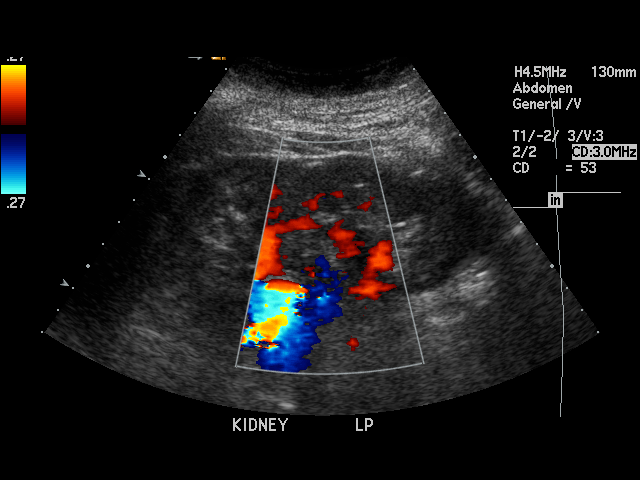

[17 of 25 positions shown; findings below may reference images not displayed]

PROCEDURE:     US  - US KIDNEY  - October 19, 2009  [DATE]

RESULT:     The right kidney measures 9.6 x 5.4 x 5.2 cm. The echotexture of
the renal cortex on the right remains lower than that of the adjacent liver.
I see no cystic or solid masses or hydronephrosis. A nonobstructing midpole
stone is suspected.

The left kidney measures 13.6 x 5.9 x 5 cm. There appears to be a duplicated
collecting system. There is no hydronephrosis. There is a calcification
associated with a cyst at the lower pole of the left kidney. This measures
less than 1 cm in diameter.
IMPRESSION: 1. There nonobstructing stones in both kidneys.
2. There is a cyst with an associated calcification in the lower pole of the
left kidney.
3. There is likely a duplex collecting system on the left. The left kidney
is larger than the right.

## 2012-02-18 ENCOUNTER — Encounter: Payer: Self-pay | Admitting: Internal Medicine

## 2012-02-20 ENCOUNTER — Encounter: Payer: Self-pay | Admitting: *Deleted

## 2012-02-26 ENCOUNTER — Ambulatory Visit (INDEPENDENT_AMBULATORY_CARE_PROVIDER_SITE_OTHER): Payer: BC Managed Care – PPO | Admitting: Internal Medicine

## 2012-02-26 ENCOUNTER — Ambulatory Visit: Payer: Self-pay | Admitting: Internal Medicine

## 2012-02-26 ENCOUNTER — Telehealth: Payer: Self-pay | Admitting: Internal Medicine

## 2012-02-26 ENCOUNTER — Encounter: Payer: Self-pay | Admitting: Internal Medicine

## 2012-02-26 ENCOUNTER — Other Ambulatory Visit: Payer: Self-pay | Admitting: Internal Medicine

## 2012-02-26 VITALS — BP 122/82 | HR 72 | Temp 97.6°F | Ht 65.0 in | Wt 151.0 lb

## 2012-02-26 DIAGNOSIS — R1012 Left upper quadrant pain: Secondary | ICD-10-CM

## 2012-02-26 LAB — POCT URINALYSIS DIPSTICK
Bilirubin, UA: NEGATIVE
Ketones, UA: NEGATIVE
Nitrite, UA: NEGATIVE
Spec Grav, UA: 1.02
pH, UA: 8.5

## 2012-02-26 NOTE — Assessment & Plan Note (Signed)
Symptoms are most consistent with constipation however patient has history of complex cyst of the left kidney and question if recent increase in pain may be related to cyst. Will get CT of the abdomen for further evaluation. Hematuria was noted on urinalysis however this has been present for several years. Patient is status post cystoscopy in the past which was unrevealing. Followup one week.

## 2012-02-26 NOTE — Telephone Encounter (Signed)
Patient advised as instructed via telephone.

## 2012-02-26 NOTE — Progress Notes (Signed)
Subjective:    Patient ID: Jessica Spencer, female    DOB: May 24, 1965, 46 y.o.   MRN: [id number]  HPI 46 year old female with history of cirrhosis of the liver presents for acute visit complaining of one-week history of severe left upper quadrant pain. She reports that this first began in the setting of constipation. She notes that her diet has not included as much fiber recently and she had some issues of constipation and hemorrhoids. She had started taking MiraLax up to twice daily with some improvement. With passage of soft stool, she has had some improvement in her pain. Pain is described as aching and intermittent. She notes a history of complex cyst of the left kidney. This was most recently evaluated by ultrasound one year ago. She notes a long history of hematuria which has been evaluated by cystoscopy on several occasions, which have been unrevealing. She denies any fever, chills, nausea, blood in her stool, or other symptoms.  Outpatient Encounter Prescriptions as of 02/26/2012  Medication Sig Dispense Refill  . cholecalciferol (VITAMIN D) 1000 UNITS tablet Take 1,000 Units by mouth 2 (two) times daily.        . vitamin E 800 UNIT capsule Take 800 Units by mouth daily.         BP 122/82  Pulse 72  Temp 97.6 F (36.4 C) (Oral)  Ht 5' 5"  (1.651 m)  Wt 151 lb (68.493 kg)  BMI 25.13 kg/m2  SpO2 97%  Review of Systems  Constitutional: Negative for fever, chills, appetite change, fatigue and unexpected weight change.  HENT: Negative for ear pain, congestion, sore throat, trouble swallowing, neck pain, voice change and sinus pressure.   Eyes: Negative for visual disturbance.  Respiratory: Negative for cough, shortness of breath, wheezing and stridor.   Cardiovascular: Negative for chest pain, palpitations and leg swelling.  Gastrointestinal: Positive for abdominal pain and constipation. Negative for nausea, vomiting, diarrhea, blood in stool, abdominal distention and anal bleeding.    Genitourinary: Negative for dysuria and flank pain.  Musculoskeletal: Negative for myalgias, arthralgias and gait problem.  Skin: Negative for color change and rash.  Neurological: Negative for dizziness and headaches.  Hematological: Negative for adenopathy. Does not bruise/bleed easily.  Psychiatric/Behavioral: Negative for suicidal ideas, sleep disturbance and dysphoric mood. The patient is not nervous/anxious.        Objective:   Physical Exam  Constitutional: She is oriented to person, place, and time. She appears well-developed and well-nourished. No distress.  HENT:  Head: Normocephalic and atraumatic.  Right Ear: External ear normal.  Left Ear: External ear normal.  Nose: Nose normal.  Mouth/Throat: Oropharynx is clear and moist. No oropharyngeal exudate.  Eyes: Conjunctivae normal are normal. Pupils are equal, round, and reactive to light. Right eye exhibits no discharge. Left eye exhibits no discharge. No scleral icterus.  Neck: Normal range of motion. Neck supple. No tracheal deviation present. No thyromegaly present.  Cardiovascular: Normal rate, regular rhythm, normal heart sounds and intact distal pulses.  Exam reveals no gallop and no friction rub.   No murmur heard. Pulmonary/Chest: Effort normal and breath sounds normal. No respiratory distress. She has no wheezes. She has no rales. She exhibits no tenderness.  Abdominal: Soft. Bowel sounds are normal. She exhibits no distension and no mass. There is tenderness (left upper quad). There is no rebound and no guarding.  Musculoskeletal: Normal range of motion. She exhibits no edema and no tenderness.  Lymphadenopathy:    She has no cervical adenopathy.  Neurological: She  is alert and oriented to person, place, and time. No cranial nerve deficit. She exhibits normal muscle tone. Coordination normal.  Skin: Skin is warm and dry. No rash noted. She is not diaphoretic. No erythema. No pallor.  Psychiatric: She has a normal  mood and affect. Her behavior is normal. Judgment and thought content normal.          Assessment & Plan:

## 2012-02-26 NOTE — Telephone Encounter (Signed)
I will have Dr. Nicki Reaper review since Dr. Gilford Rile is out the office this afternoon.

## 2012-02-26 NOTE — Telephone Encounter (Signed)
Ginger called stating that dr walker was going to call her today with her ct results she wanted to make sure you all call her cell phon [phone]

## 2012-02-26 NOTE — Telephone Encounter (Signed)
Notify pt - no acute change on CT when compared to previous.  Show to Dr Gilford Rile for plans for further w/up  - follow up.

## 2012-02-27 ENCOUNTER — Encounter: Payer: Self-pay | Admitting: Internal Medicine

## 2012-02-28 LAB — CREATININE, SERUM
Creatinine, Ser: 0.63 mg/dL (ref 0.57–1.00)
GFR calc Af Amer: 124 mL/min/{1.73_m2} (ref >59)
GFR calc non Af Amer: 107 mL/min/{1.73_m2} (ref >59)

## 2012-03-10 ENCOUNTER — Encounter: Payer: Self-pay | Admitting: Internal Medicine

## 2012-04-18 ENCOUNTER — Ambulatory Visit (INDEPENDENT_AMBULATORY_CARE_PROVIDER_SITE_OTHER): Payer: BC Managed Care – PPO | Admitting: Adult Health

## 2012-04-18 ENCOUNTER — Encounter: Payer: Self-pay | Admitting: Adult Health

## 2012-04-18 VITALS — BP 122/80 | HR 96 | Temp 98.1°F | Ht 65.0 in | Wt 158.0 lb

## 2012-04-18 DIAGNOSIS — J111 Influenza due to unidentified influenza virus with other respiratory manifestations: Secondary | ICD-10-CM

## 2012-04-18 DIAGNOSIS — J329 Chronic sinusitis, unspecified: Secondary | ICD-10-CM

## 2012-04-18 MED ORDER — HYDROCODONE-HOMATROPINE 5-1.5 MG/5ML PO SYRP: 5.0000 mL | ORAL_SOLUTION | Freq: Three times a day (TID) | ORAL | Status: DC | PRN

## 2012-04-18 MED ORDER — FLUTICASONE PROPIONATE 50 MCG/ACT NA SUSP: NASAL | Status: DC

## 2012-04-18 MED ORDER — AZITHROMYCIN 250 MG PO TABS: ORAL_TABLET | ORAL | Status: DC

## 2012-04-18 NOTE — Assessment & Plan Note (Signed)
Rhinorrhea, congestion, fever, post nasal drip. Start azithromycin and flonase. Gave prescription for hycodan. She is aware not to fill this unless cough is severe.

## 2012-04-18 NOTE — Progress Notes (Signed)
  Subjective:    Patient ID: Jessica Spencer, female    DOB: May 22, 1965, 47 y.o.   MRN: [id number]  HPI  Patient presents with the following symptoms which began on Monday: sore throat, fever (100.6), chest soreness, dry cough, rhinorrhea that is yellow in color. She has tried some ibuprofen to keep the fever down and also some decongestant/cough medication.    Current Outpatient Prescriptions on File Prior to Visit  Medication Sig Dispense Refill  . cholecalciferol (VITAMIN D) 1000 UNITS tablet Take 1,000 Units by mouth 2 (two) times daily.        . vitamin E 800 UNIT capsule Take 800 Units by mouth daily.           Review of Systems  Constitutional: Positive for fever and chills. Negative for appetite change.  HENT: Positive for congestion, sore throat, rhinorrhea, voice change, postnasal drip and sinus pressure.   Eyes: Positive for discharge. Negative for redness.  Respiratory: Positive for cough and chest tightness. Negative for shortness of breath and wheezing.   Cardiovascular: Negative for chest pain.  Gastrointestinal: Negative.   Genitourinary: Negative.   Skin: Negative for rash.  Neurological: Positive for dizziness.  Psychiatric/Behavioral: Negative.     BP 122/80  Pulse 96  Temp 98.1 F (36.7 C) (Oral)  Ht 5' 5"  (1.651 m)  Wt 158 lb (71.668 kg)  BMI 26.29 kg/m2  SpO2 98%      Objective:   Physical Exam  Constitutional: She is oriented to person, place, and time. She appears well-developed and well-nourished.       Sick appearing  HENT:  Head: Normocephalic.  Right Ear: External ear normal.  Left Ear: External ear normal.  Mouth/Throat: No oropharyngeal exudate.  Eyes: Pupils are equal, round, and reactive to light.  Cardiovascular: Normal rate, regular rhythm and normal heart sounds.  Exam reveals no gallop.   No murmur heard. Pulmonary/Chest: Effort normal and breath sounds normal. She has no wheezes.  Abdominal: Soft. Bowel sounds are normal.    Lymphadenopathy:    She has cervical adenopathy.  Neurological: She is alert and oriented to person, place, and time.  Skin: Skin is warm and dry. No rash noted.  Psychiatric: She has a normal mood and affect. Her behavior is normal. Judgment and thought content normal.       Assessment & Plan:

## 2012-04-18 NOTE — Patient Instructions (Addendum)
  You have a sinus inflection.  I have sent a prescriptions for Azithromycin and Flonase to your pharmacy.  Drink plenty of fluids to maintain hydration.  You can continue to take ibuprofen to manage your fever.  Fill the prescription for Hycodan if your cough becomes severe enough that it does not allow you to sleep. Use caution while taking because it causes drowsiness.

## 2012-05-05 DIAGNOSIS — R3129 Other microscopic hematuria: Secondary | ICD-10-CM | POA: Insufficient documentation

## 2012-05-05 DIAGNOSIS — D4100 Neoplasm of uncertain behavior of unspecified kidney: Secondary | ICD-10-CM | POA: Insufficient documentation

## 2012-05-07 ENCOUNTER — Telehealth: Payer: Self-pay | Admitting: *Deleted

## 2012-05-07 ENCOUNTER — Ambulatory Visit: Payer: Self-pay | Admitting: Urology

## 2012-05-07 NOTE — Telephone Encounter (Signed)
This was ordered prior to imaging, but I am not sure what diagnosis code we would use for this?

## 2012-05-07 NOTE — Telephone Encounter (Signed)
Can we use abdominal pain 789.01?

## 2012-05-07 NOTE — Telephone Encounter (Signed)
[facility] called and would like to know for the creatinine, serum what dx code did you want to use for it? Thank you

## 2012-05-31 ENCOUNTER — Other Ambulatory Visit: Payer: Self-pay

## 2012-06-02 ENCOUNTER — Encounter: Payer: Self-pay | Admitting: Internal Medicine

## 2012-07-04 ENCOUNTER — Ambulatory Visit: Payer: BC Managed Care – PPO | Admitting: Internal Medicine

## 2012-08-04 ENCOUNTER — Encounter: Payer: Self-pay | Admitting: Internal Medicine

## 2012-08-04 ENCOUNTER — Ambulatory Visit (INDEPENDENT_AMBULATORY_CARE_PROVIDER_SITE_OTHER): Payer: BC Managed Care – PPO | Admitting: Internal Medicine

## 2012-08-04 VITALS — BP 160/88 | HR 69 | Temp 98.4°F | Wt 154.0 lb

## 2012-08-04 DIAGNOSIS — K7581 Nonalcoholic steatohepatitis (NASH): Secondary | ICD-10-CM

## 2012-08-04 DIAGNOSIS — F32A Depression, unspecified: Secondary | ICD-10-CM

## 2012-08-04 DIAGNOSIS — F341 Dysthymic disorder: Secondary | ICD-10-CM

## 2012-08-04 DIAGNOSIS — K7689 Other specified diseases of liver: Secondary | ICD-10-CM

## 2012-08-04 DIAGNOSIS — R945 Abnormal results of liver function studies: Secondary | ICD-10-CM

## 2012-08-04 DIAGNOSIS — R319 Hematuria, unspecified: Secondary | ICD-10-CM

## 2012-08-04 DIAGNOSIS — F411 Generalized anxiety disorder: Secondary | ICD-10-CM

## 2012-08-04 DIAGNOSIS — E669 Obesity, unspecified: Secondary | ICD-10-CM

## 2012-08-04 DIAGNOSIS — K589 Irritable bowel syndrome without diarrhea: Secondary | ICD-10-CM

## 2012-08-04 DIAGNOSIS — G43109 Migraine with aura, not intractable, without status migrainosus: Secondary | ICD-10-CM

## 2012-08-04 DIAGNOSIS — F329 Major depressive disorder, single episode, unspecified: Secondary | ICD-10-CM

## 2012-08-04 DIAGNOSIS — Z0000 Encounter for general adult medical examination without abnormal findings: Secondary | ICD-10-CM

## 2012-08-04 DIAGNOSIS — R599 Enlarged lymph nodes, unspecified: Secondary | ICD-10-CM

## 2012-08-04 DIAGNOSIS — K5900 Constipation, unspecified: Secondary | ICD-10-CM

## 2012-08-04 DIAGNOSIS — R591 Generalized enlarged lymph nodes: Secondary | ICD-10-CM | POA: Insufficient documentation

## 2012-08-04 LAB — COMPREHENSIVE METABOLIC PANEL
Albumin: 4.6 g/dL (ref 3.5–5.2)
BUN: 11 mg/dL (ref 6–23)
Calcium: 9.5 mg/dL (ref 8.4–10.5)
Chloride: 102 mEq/L (ref 96–112)
Creatinine, Ser: 0.6 mg/dL (ref 0.4–1.2)
Glucose, Bld: 95 mg/dL (ref 70–99)
Potassium: 3.5 mEq/L (ref 3.5–5.1)

## 2012-08-04 LAB — LACTATE DEHYDROGENASE: LDH: 137 U/L (ref 94–250)

## 2012-08-04 LAB — CBC WITH DIFFERENTIAL/PLATELET
Basophils Relative: 0.3 % (ref 0.0–3.0)
Eosinophils Relative: 0.2 % (ref 0.0–5.0)
Lymphocytes Relative: 24.4 % (ref 12.0–46.0)
Monocytes Relative: 4.8 % (ref 3.0–12.0)
Neutrophils Relative %: 70.3 % (ref 43.0–77.0)
RBC: 4.43 Mil/uL (ref 3.87–5.11)
WBC: 5.4 10*3/uL (ref 4.5–10.5)

## 2012-08-04 LAB — URIC ACID: Uric Acid, Serum: 4.8 mg/dL (ref 2.4–7.0)

## 2012-08-04 NOTE — Assessment & Plan Note (Signed)
LFTs stable today. Plan for follow up at [facility] for repeat liver US in 09/2012.

## 2012-08-04 NOTE — Progress Notes (Signed)
Subjective:    Patient ID: Jessica Spencer, female    DOB: Jul 20, 1965, 47 y.o.   MRN: [id number]  HPI 47 year old female with history of nonalcoholic steatohepatitis, generalized anxiety presents for followup. She reports it has been a difficult time for her. Her husband has been ill after being hit by a stray bullet. He has been in and out of hospital. She reports significant anxiety related to this. She is also extremely anxious because several family members have been diagnosed with lymphoma. She is concerned about enlarged lymph nodes in her neck which have been present for several months. She denies any symptoms of night sweats, weight loss, change in appetite. She does have some fatigue.  Outpatient Encounter Prescriptions as of 08/04/2012  Medication Sig Dispense Refill  . vitamin E 800 UNIT capsule Take 800 Units by mouth daily.        . cholecalciferol (VITAMIN D) 1000 UNITS tablet Take 1,000 Units by mouth 2 (two) times daily.        . fluticasone (FLONASE) 50 MCG/ACT nasal spray 2 sprays in each nostril daily  16 g  6  . HYDROcodone-homatropine (HYCODAN) 5-1.5 MG/5ML syrup Take 5 mLs by mouth every 8 (eight) hours as needed for cough.  120 mL  0  . [DISCONTINUED] azithromycin (ZITHROMAX) 250 MG tablet Take 2 tablets on the first day, then take 1 tablet daily for the next 4 days.  6 tablet  0   No facility-administered encounter medications on file as of 08/04/2012.   BP 160/88  Pulse 69  Temp(Src) 98.4 F (36.9 C) (Oral)  Wt 154 lb (69.854 kg)  BMI 25.63 kg/m2  SpO2 99%  Review of Systems  Constitutional: Positive for fatigue. Negative for fever, chills, appetite change and unexpected weight change.  HENT: Negative for ear pain, congestion, sore throat, trouble swallowing, neck pain, voice change and sinus pressure.   Eyes: Negative for visual disturbance.  Respiratory: Negative for cough, shortness of breath, wheezing and stridor.   Cardiovascular: Negative for chest pain,  palpitations and leg swelling.  Gastrointestinal: Negative for nausea, vomiting, abdominal pain, diarrhea, constipation, blood in stool, abdominal distention and anal bleeding.  Genitourinary: Negative for dysuria and flank pain.  Musculoskeletal: Positive for myalgias. Negative for arthralgias and gait problem.  Skin: Negative for color change and rash.  Neurological: Negative for dizziness and headaches.  Hematological: Positive for adenopathy. Does not bruise/bleed easily.  Psychiatric/Behavioral: Positive for dysphoric mood. Negative for suicidal ideas and sleep disturbance. The patient is nervous/anxious.        Objective:   Physical Exam  Constitutional: She is oriented to person, place, and time. She appears well-developed and well-nourished. No distress.  HENT:  Head: Normocephalic and atraumatic.  Right Ear: External ear normal.  Left Ear: External ear normal.  Nose: Nose normal.  Mouth/Throat: Oropharynx is clear and moist. No oropharyngeal exudate.  Eyes: Conjunctivae are normal. Pupils are equal, round, and reactive to light. Right eye exhibits no discharge. Left eye exhibits no discharge. No scleral icterus.  Neck: Normal range of motion. Neck supple. No tracheal deviation present. No thyromegaly present.  Cardiovascular: Normal rate, regular rhythm, normal heart sounds and intact distal pulses.  Exam reveals no gallop and no friction rub.   No murmur heard. Pulmonary/Chest: Effort normal and breath sounds normal. No respiratory distress. She has no wheezes. She has no rales. She exhibits no tenderness.  Abdominal: Soft. Normal appearance and bowel sounds are normal. There is no hepatosplenomegaly. There is no tenderness.  Musculoskeletal: Normal range of motion. She exhibits no edema and no tenderness.  Lymphadenopathy:       Head (right side): No occipital adenopathy present.       Head (left side): No occipital adenopathy present.    She has cervical adenopathy.        Right cervical: Superficial cervical adenopathy present.       Left cervical: Superficial cervical adenopathy present.    She has no axillary adenopathy.       Right: No inguinal and no supraclavicular adenopathy present.       Left: No inguinal and no supraclavicular adenopathy present.  Neurological: She is alert and oriented to person, place, and time. No cranial nerve deficit. She exhibits normal muscle tone. Coordination normal.  Skin: Skin is warm and dry. No rash noted. She is not diaphoretic. No erythema. No pallor.  Psychiatric: She has a normal mood and affect. Her behavior is normal. Judgment and thought content normal.          Assessment & Plan:

## 2012-08-04 NOTE — Assessment & Plan Note (Signed)
Few shoddy cervical nodes. Offered reassurance today. Suspect viral syndrome. Labs including CBC, CMP, ESR normal. Pt will call if lymph nodes are getting larger or if she has other concerns.

## 2012-08-04 NOTE — Assessment & Plan Note (Signed)
Symptoms recently worsening with multiple stressors. Offered support today. Recommended starting medication to help with symptoms, however pt declined.

## 2012-08-29 ENCOUNTER — Encounter: Payer: Self-pay | Admitting: Internal Medicine

## 2012-08-29 ENCOUNTER — Ambulatory Visit (INDEPENDENT_AMBULATORY_CARE_PROVIDER_SITE_OTHER): Payer: BC Managed Care – PPO | Admitting: Internal Medicine

## 2012-08-29 VITALS — BP 128/80 | HR 64 | Temp 98.1°F | Wt 156.0 lb

## 2012-08-29 DIAGNOSIS — R599 Enlarged lymph nodes, unspecified: Secondary | ICD-10-CM

## 2012-08-29 DIAGNOSIS — R591 Generalized enlarged lymph nodes: Secondary | ICD-10-CM

## 2012-08-29 DIAGNOSIS — M542 Cervicalgia: Secondary | ICD-10-CM

## 2012-08-29 NOTE — Assessment & Plan Note (Signed)
Few, shoddy lymph nodes noted in the anterior neck. No change compared to previous. Suspect related to previous viral infection. Will continue to monitor.

## 2012-08-29 NOTE — Progress Notes (Signed)
Subjective:    Patient ID: Jessica Spencer, female    DOB: [birth year], 47 y.o.   MRN: [id number]  HPI 47 year old female with history of steatohepatitis presents for followup after recent episode of neck pain and enlarged lymph nodes. She reports that symptoms have been stable since her last visit. She has not noticed any change in the size of her lymph nodes. She continues to have occasional aching pain in her upper lateral neck and occasionally under her arms. She has been limiting physical activity in hopes of improving pain. She has not taken any medication for pain. We reviewed recent labs which showed normal blood counts, electrolytes, and markers of inflammation.  Outpatient Encounter Prescriptions as of 08/29/2012  Medication Sig Dispense Refill  . cholecalciferol (VITAMIN D) 1000 UNITS tablet Take 1,000 Units by mouth 2 (two) times daily.        . polyethylene glycol (MIRALAX / GLYCOLAX) packet Take 17 g by mouth daily.      . vitamin E 800 UNIT capsule Take 800 Units by mouth daily.        . fluticasone (FLONASE) 50 MCG/ACT nasal spray 2 sprays in each nostril daily  16 g  6  . HYDROcodone-homatropine (HYCODAN) 5-1.5 MG/5ML syrup Take 5 mLs by mouth every 8 (eight) hours as needed for cough.  120 mL  0   No facility-administered encounter medications on file as of 08/29/2012.   BP 128/80  Pulse 64  Temp(Src) 98.1 F (36.7 C) (Oral)  Wt 156 lb (70.761 kg)  BMI 25.96 kg/m2  SpO2 99%  Review of Systems  Constitutional: Negative for fever, chills, appetite change, fatigue and unexpected weight change.  HENT: Positive for neck pain. Negative for ear pain, congestion, sore throat, trouble swallowing, voice change and sinus pressure.   Eyes: Negative for visual disturbance.  Respiratory: Negative for cough, shortness of breath, wheezing and stridor.   Cardiovascular: Negative for chest pain, palpitations and leg swelling.  Gastrointestinal: Negative for nausea, vomiting, abdominal  pain, diarrhea, constipation, blood in stool, abdominal distention and anal bleeding.  Genitourinary: Negative for dysuria and flank pain.  Musculoskeletal: Positive for myalgias and arthralgias. Negative for gait problem.  Skin: Negative for color change and rash.  Neurological: Negative for dizziness and headaches.  Hematological: Positive for adenopathy. Does not bruise/bleed easily.  Psychiatric/Behavioral: Negative for suicidal ideas, sleep disturbance and dysphoric mood. The patient is nervous/anxious.        Objective:   Physical Exam  Constitutional: She is oriented to person, place, and time. She appears well-developed and well-nourished. No distress.  HENT:  Head: Normocephalic and atraumatic.  Right Ear: External ear normal.  Left Ear: External ear normal.  Nose: Nose normal.  Mouth/Throat: Oropharynx is clear and moist. No oropharyngeal exudate.  Eyes: Conjunctivae are normal. Pupils are equal, round, and reactive to light. Right eye exhibits no discharge. Left eye exhibits no discharge. No scleral icterus.  Neck: Normal range of motion. Neck supple. No tracheal deviation present. No thyromegaly present.  Cardiovascular: Normal rate, regular rhythm, normal heart sounds and intact distal pulses.  Exam reveals no gallop and no friction rub.   No murmur heard. Pulmonary/Chest: Effort normal and breath sounds normal. No accessory muscle usage. Not tachypneic. No respiratory distress. She has no decreased breath sounds. She has no wheezes. She has no rhonchi. She has no rales. She exhibits no tenderness.  Musculoskeletal: Normal range of motion. She exhibits no edema and no tenderness.       Cervical  back: She exhibits pain. She exhibits normal range of motion, no tenderness and no bony tenderness.  Lymphadenopathy:    She has no cervical adenopathy.  Neurological: She is alert and oriented to person, place, and time. No cranial nerve deficit. She exhibits normal muscle tone.  Coordination normal.  Skin: Skin is warm and dry. No rash noted. She is not diaphoretic. No erythema. No pallor.  Psychiatric: She has a normal mood and affect. Her behavior is normal. Judgment and thought content normal.          Assessment & Plan:

## 2012-08-29 NOTE — Assessment & Plan Note (Signed)
Bilateral neck pain likely secondary to spasm of the trapezius muscle. Will start physical therapy to see if any improvement. Follow up in 4 weeks and prn.

## 2012-09-01 ENCOUNTER — Ambulatory Visit: Payer: BC Managed Care – PPO | Admitting: Internal Medicine

## 2012-10-03 ENCOUNTER — Encounter: Payer: Self-pay | Admitting: Internal Medicine

## 2012-10-03 ENCOUNTER — Ambulatory Visit (INDEPENDENT_AMBULATORY_CARE_PROVIDER_SITE_OTHER): Payer: BC Managed Care – PPO | Admitting: Internal Medicine

## 2012-10-03 VITALS — BP 120/88 | HR 76 | Temp 98.4°F | Wt 149.0 lb

## 2012-10-03 DIAGNOSIS — K7689 Other specified diseases of liver: Secondary | ICD-10-CM

## 2012-10-03 DIAGNOSIS — K7581 Nonalcoholic steatohepatitis (NASH): Secondary | ICD-10-CM

## 2012-10-03 DIAGNOSIS — F411 Generalized anxiety disorder: Secondary | ICD-10-CM | POA: Insufficient documentation

## 2012-10-03 DIAGNOSIS — M542 Cervicalgia: Secondary | ICD-10-CM

## 2012-10-03 DIAGNOSIS — Z0000 Encounter for general adult medical examination without abnormal findings: Secondary | ICD-10-CM

## 2012-10-03 NOTE — Assessment & Plan Note (Signed)
Reviewed recent records from evaluation at [facility] including ultrasound and lab work which were stable. Reassured patient that ultrasound showed no changes or acute abnormalities. Followup 3 months.

## 2012-10-03 NOTE — Progress Notes (Signed)
Subjective:    Patient ID: Jessica Spencer, female    DOB: 03-19-1966, 47 y.o.   MRN: [id number]  HPI 47 year old female with history of steatohepatitis, generalized anxiety disorder, recent neck pain presents for followup. She underwent physical therapy for her neck pain reports significant improvement in symptoms.  She is tearful today describing ongoing anxiety about liver dysfunction. She was seen at [facility] 2 days ago and underwent liver ultrasound. She did not yet receive report on this. We reviewed the reports on line through affect today. Ultrasound showed normal findings with steatohepatitis described as mild but no focal nodular areas. Blood work showed stable liver function.  She is also tearful describing ongoing issue with previous infidelity with her husband. She is concerned that she may have HIV. She reports infidelity occurred between 8 and 10 years ago.  Outpatient Encounter Prescriptions as of 10/03/2012  Medication Sig Dispense Refill  . cholecalciferol (VITAMIN D) 1000 UNITS tablet Take 1,000 Units by mouth 2 (two) times daily.        . polyethylene glycol (MIRALAX / GLYCOLAX) packet Take 17 g by mouth daily.      . vitamin E 800 UNIT capsule Take 800 Units by mouth daily.         No facility-administered encounter medications on file as of 10/03/2012.   BP 120/88  Pulse 76  Temp(Src) 98.4 F (36.9 C) (Oral)  Wt 149 lb (67.586 kg)  BMI 24.79 kg/m2  SpO2 98%  Review of Systems  Constitutional: Negative for fever, chills, appetite change, fatigue and unexpected weight change.  HENT: Negative for ear pain, congestion, sore throat, trouble swallowing, neck pain, voice change and sinus pressure.   Eyes: Negative for visual disturbance.  Respiratory: Negative for cough, shortness of breath, wheezing and stridor.   Cardiovascular: Negative for chest pain, palpitations and leg swelling.  Gastrointestinal: Negative for nausea, vomiting, abdominal pain, diarrhea, constipation,  blood in stool, abdominal distention and anal bleeding.  Genitourinary: Negative for dysuria and flank pain.  Musculoskeletal: Negative for myalgias, arthralgias and gait problem.  Skin: Negative for color change and rash.  Neurological: Negative for dizziness and headaches.  Hematological: Negative for adenopathy. Does not bruise/bleed easily.  Psychiatric/Behavioral: Positive for dysphoric mood. Negative for suicidal ideas and sleep disturbance. The patient is nervous/anxious.        Objective:   Physical Exam  Constitutional: She is oriented to person, place, and time. She appears well-developed and well-nourished. No distress.  HENT:  Head: Normocephalic and atraumatic.  Right Ear: External ear normal.  Left Ear: External ear normal.  Nose: Nose normal.  Mouth/Throat: Oropharynx is clear and moist. No oropharyngeal exudate.  Eyes: Conjunctivae are normal. Pupils are equal, round, and reactive to light. Right eye exhibits no discharge. Left eye exhibits no discharge. No scleral icterus.  Neck: Normal range of motion. Neck supple. No tracheal deviation present. No thyromegaly present.  Cardiovascular: Normal rate, regular rhythm, normal heart sounds and intact distal pulses.  Exam reveals no gallop and no friction rub.   No murmur heard. Pulmonary/Chest: Effort normal and breath sounds normal. No accessory muscle usage. Not tachypneic. No respiratory distress. She has no decreased breath sounds. She has no wheezes. She has no rhonchi. She has no rales. She exhibits no tenderness.  Abdominal: Soft. Bowel sounds are normal. She exhibits no distension. There is no tenderness. There is no rebound and no guarding.  Musculoskeletal: Normal range of motion. She exhibits no edema and no tenderness.  Lymphadenopathy:  She has no cervical adenopathy.  Neurological: She is alert and oriented to person, place, and time. No cranial nerve deficit. She exhibits normal muscle tone. Coordination  normal.  Skin: Skin is warm and dry. No rash noted. She is not diaphoretic. No erythema. No pallor.  Psychiatric: She has a normal mood and affect. Her behavior is normal. Judgment and thought content normal.          Assessment & Plan:

## 2012-10-03 NOTE — Assessment & Plan Note (Signed)
Patient with generalized anxiety disorder which has significant impact on all aspects of her life. Encouraged her to consider medication. She declines given her concerns about potential side effects. Will set up counseling. Encouraged her to consider reading the book Feeling Good by Quay Burow.Follow up here in 3 months and prn.

## 2012-10-03 NOTE — Assessment & Plan Note (Signed)
Neck pain improved with physical therapy. Will continue to monitor.

## 2012-10-07 ENCOUNTER — Ambulatory Visit (INDEPENDENT_AMBULATORY_CARE_PROVIDER_SITE_OTHER): Payer: BC Managed Care – PPO | Admitting: Psychology

## 2012-10-07 DIAGNOSIS — F4323 Adjustment disorder with mixed anxiety and depressed mood: Secondary | ICD-10-CM

## 2012-10-11 ENCOUNTER — Encounter: Payer: Self-pay | Admitting: Internal Medicine

## 2012-10-29 ENCOUNTER — Ambulatory Visit (INDEPENDENT_AMBULATORY_CARE_PROVIDER_SITE_OTHER): Payer: BC Managed Care – PPO | Admitting: Psychology

## 2012-10-29 DIAGNOSIS — F331 Major depressive disorder, recurrent, moderate: Secondary | ICD-10-CM

## 2012-11-21 ENCOUNTER — Encounter: Payer: Self-pay | Admitting: Internal Medicine

## 2012-11-21 ENCOUNTER — Ambulatory Visit (INDEPENDENT_AMBULATORY_CARE_PROVIDER_SITE_OTHER): Payer: BC Managed Care – PPO | Admitting: Internal Medicine

## 2012-11-21 VITALS — BP 120/82 | HR 65 | Temp 98.7°F | Wt 149.0 lb

## 2012-11-21 DIAGNOSIS — IMO0001 Reserved for inherently not codable concepts without codable children: Secondary | ICD-10-CM

## 2012-11-21 DIAGNOSIS — F411 Generalized anxiety disorder: Secondary | ICD-10-CM

## 2012-11-21 LAB — COMPREHENSIVE METABOLIC PANEL
Albumin: 4.2 g/dL (ref 3.5–5.2)
BUN: 12 mg/dL (ref 6–23)
CO2: 30 mEq/L (ref 19–32)
Calcium: 9.8 mg/dL (ref 8.4–10.5)
Chloride: 101 mEq/L (ref 96–112)
Creat: 0.59 mg/dL (ref 0.50–1.10)
Glucose, Bld: 85 mg/dL (ref 70–99)
Potassium: 3.9 mEq/L (ref 3.5–5.3)

## 2012-11-21 LAB — MAGNESIUM: Magnesium: 1.8 mg/dL (ref 1.5–2.5)

## 2012-11-21 MED ORDER — DULOXETINE HCL 30 MG PO CPEP: 30.0000 mg | ORAL_CAPSULE | Freq: Every day | ORAL | Status: DC

## 2012-11-21 NOTE — Assessment & Plan Note (Signed)
Diffuse myalgias which vary from day to day. Exam is normal. Suspect fibromyalgia worsened by chronic anxiety. Will start Cymbalta 73m daily. [place] [facility] [place] [facility]. ANA was pos in the past, so will repeat with additional testing for Sjogrens given h/o dry eyes. Follow up 2 weeks and prn.

## 2012-11-21 NOTE — Progress Notes (Signed)
Subjective:    Patient ID: Jessica Spencer, female    DOB: 06-08-1965, 47 y.o.   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 Complains of generalized malaise, diffuse myalgias in arms, neck, shoulders, abdomen, legs over last several weeks.  Also notes fatigue. Had fever of 59F on Tuesday of this week. No URI symptoms. No nausea, vomiting, change in appetite. Concerned that she has life threatening illness that has been missed. Anxiety is gradually worsening. Her husband has strongly encouraged her to consider medication to help control anxiety.  Outpatient Prescriptions Prior to Visit  Medication Sig Dispense Refill  . cholecalciferol (VITAMIN D) 1000 UNITS tablet Take 1,000 Units by mouth 2 (two) times daily.        . polyethylene glycol (MIRALAX / GLYCOLAX) packet Take 17 g by mouth daily.      . vitamin E 800 UNIT capsule Take 800 Units by mouth daily.         No facility-administered medications prior to visit.   BP 120/82  Pulse 65  Temp(Src) 98.7 F (37.1 C) (Oral)  Wt 149 lb (67.586 kg)  BMI 24.79 kg/m2  SpO2 97%  Review of Systems  Constitutional: Positive for fever (59F on Tues this week) and fatigue. Negative for chills, appetite change and unexpected weight change.  HENT: Negative for ear pain, congestion, sore throat, trouble swallowing, neck pain, voice change and sinus pressure.   Eyes: Negative for visual disturbance.  Respiratory: Negative for cough, shortness of breath, wheezing and stridor.   Cardiovascular: Negative for chest pain, palpitations and leg swelling.  Gastrointestinal: Negative for nausea, vomiting, abdominal pain, diarrhea, constipation, blood in stool, abdominal distention and anal bleeding.  Genitourinary: Negative for dysuria and flank pain.  Musculoskeletal: Positive for myalgias. Negative for arthralgias and gait problem.  Skin: Negative for color change and rash.  Neurological: Negative for  dizziness and headaches.  Hematological: Negative for adenopathy. Does not bruise/bleed easily.  Psychiatric/Behavioral: Negative for suicidal ideas, sleep disturbance and dysphoric mood. The patient is not nervous/anxious.        Objective:   Physical Exam  Constitutional: She is oriented to person, place, and time. She appears well-developed and well-nourished. No distress.  HENT:  Head: Normocephalic and atraumatic.  Right Ear: External ear normal.  Left Ear: External ear normal.  Nose: Nose normal.  Mouth/Throat: Oropharynx is clear and moist. No oropharyngeal exudate.  Eyes: Conjunctivae are normal. Pupils are equal, round, and reactive to light. Right eye exhibits no discharge. Left eye exhibits no discharge. No scleral icterus.  Neck: Normal range of motion. Neck supple. No tracheal deviation present. No thyromegaly present.  Cardiovascular: Normal rate, regular rhythm, normal heart sounds and intact distal pulses.  Exam reveals no gallop and no friction rub.   No murmur heard. Pulmonary/Chest: Effort normal and breath sounds normal. No accessory muscle usage. Not tachypneic. No respiratory distress. She has no decreased breath sounds. She has no wheezes. She has no rhonchi. She has no rales. She exhibits no tenderness.  Abdominal: Soft. Bowel sounds are normal. She exhibits no distension. There is no tenderness. There is no rebound.  Musculoskeletal: Normal range of motion. She exhibits no edema and no tenderness.  Lymphadenopathy:    She has no cervical adenopathy.  Neurological: She is alert and oriented to person, place, and time. No cranial nerve deficit. She exhibits normal muscle tone. Coordination normal.  Skin: Skin is warm and dry. No rash noted. She is  not diaphoretic. No erythema. No pallor.  Psychiatric: Her behavior is normal. Judgment and thought content normal. Her mood appears anxious.          Assessment & Plan:

## 2012-11-21 NOTE — Assessment & Plan Note (Signed)
Strongly encouraged pt to consider medication to help control generalized anxiety. Recommended Cymbalta 10m daily. Gave Rx for this today.

## 2012-11-24 LAB — ANA: Anti Nuclear Antibody(ANA): NEGATIVE

## 2012-11-24 LAB — SJOGRENS SYNDROME-A EXTRACTABLE NUCLEAR ANTIBODY: SSA (Ro) (ENA) Antibody, IgG: 1 AU/mL (ref <30)

## 2012-11-25 ENCOUNTER — Ambulatory Visit: Payer: BC Managed Care – PPO | Admitting: Psychology

## 2012-11-26 ENCOUNTER — Encounter: Payer: Self-pay | Admitting: Internal Medicine

## 2012-12-10 ENCOUNTER — Ambulatory Visit: Payer: BC Managed Care – PPO | Admitting: Psychology

## 2012-12-11 ENCOUNTER — Ambulatory Visit: Payer: BC Managed Care – PPO | Admitting: Internal Medicine

## 2012-12-12 ENCOUNTER — Ambulatory Visit: Payer: BC Managed Care – PPO | Admitting: Psychology

## 2012-12-31 ENCOUNTER — Ambulatory Visit (INDEPENDENT_AMBULATORY_CARE_PROVIDER_SITE_OTHER): Payer: BC Managed Care – PPO | Admitting: Psychology

## 2012-12-31 DIAGNOSIS — F4323 Adjustment disorder with mixed anxiety and depressed mood: Secondary | ICD-10-CM

## 2013-01-12 ENCOUNTER — Encounter: Payer: Self-pay | Admitting: Internal Medicine

## 2013-01-12 ENCOUNTER — Ambulatory Visit (INDEPENDENT_AMBULATORY_CARE_PROVIDER_SITE_OTHER): Payer: BC Managed Care – PPO | Admitting: Internal Medicine

## 2013-01-12 VITALS — BP 124/80 | HR 68 | Temp 98.5°F | Ht 66.0 in | Wt 156.0 lb

## 2013-01-12 DIAGNOSIS — Z0000 Encounter for general adult medical examination without abnormal findings: Secondary | ICD-10-CM

## 2013-01-12 LAB — COMPREHENSIVE METABOLIC PANEL
Albumin: 4.1 g/dL (ref 3.5–5.2)
Alkaline Phosphatase: 69 U/L (ref 39–117)
BUN: 16 mg/dL (ref 6–23)
Creatinine, Ser: 0.6 mg/dL (ref 0.4–1.2)
Glucose, Bld: 89 mg/dL (ref 70–99)
Total Bilirubin: 1.5 mg/dL — ABNORMAL HIGH (ref 0.3–1.2)

## 2013-01-12 LAB — LIPID PANEL
Cholesterol: 215 mg/dL — ABNORMAL HIGH (ref 0–200)
HDL: 46.4 mg/dL (ref >39.00)
Total CHOL/HDL Ratio: 5
Triglycerides: 70 mg/dL (ref 0.0–149.0)
VLDL: 14 mg/dL (ref 0.0–40.0)

## 2013-01-12 NOTE — Progress Notes (Signed)
Subjective:    Patient ID: Jessica Spencer, female    DOB: Apr 07, 1966, 47 y.o.   MRN: [id number]  HPI 47YO female with h/o anxiety, NASH presents for annual exam. Generally feeling well. Has not recently been exercising. Typically follows a healthy diet. Continues to follow with local psychologist to help with symptoms of anxiety. Did not start Cymbalta.  Outpatient Encounter Prescriptions as of 01/12/2013  Medication Sig Dispense Refill  . cholecalciferol (VITAMIN D) 1000 UNITS tablet Take 1,000 Units by mouth 2 (two) times daily.        . polyethylene glycol (MIRALAX / GLYCOLAX) packet Take 17 g by mouth daily.      . vitamin E 800 UNIT capsule Take 800 Units by mouth daily.        . [DISCONTINUED] DULoxetine (CYMBALTA) 30 MG capsule Take 1 capsule (30 mg total) by mouth daily.  30 capsule  3   No facility-administered encounter medications on file as of 01/12/2013.   BP 124/80  Pulse 68  Temp(Src) 98.5 F (36.9 C) (Oral)  Ht 5' 6"  (1.676 m)  Wt 156 lb (70.761 kg)  BMI 25.19 kg/m2  SpO2 98%  Review of Systems  Constitutional: Negative for fever, chills, appetite change, fatigue and unexpected weight change.  HENT: Negative for ear pain, congestion, sore throat, trouble swallowing, neck pain, voice change and sinus pressure.   Eyes: Negative for visual disturbance.  Respiratory: Negative for cough, shortness of breath, wheezing and stridor.   Cardiovascular: Negative for chest pain, palpitations and leg swelling.  Gastrointestinal: Negative for nausea, vomiting, abdominal pain, diarrhea, constipation, blood in stool, abdominal distention and anal bleeding.  Genitourinary: Negative for dysuria and flank pain.  Musculoskeletal: Positive for myalgias. Negative for arthralgias and gait problem.  Skin: Negative for color change and rash.  Neurological: Negative for dizziness and headaches.  Hematological: Negative for adenopathy. Does not bruise/bleed easily.  Psychiatric/Behavioral:  Negative for suicidal ideas, sleep disturbance and dysphoric mood. The patient is nervous/anxious.        Objective:   Physical Exam  Constitutional: She is oriented to person, place, and time. She appears well-developed and well-nourished. No distress.  HENT:  Head: Normocephalic and atraumatic.  Right Ear: External ear normal.  Left Ear: External ear normal.  Nose: Nose normal.  Mouth/Throat: Oropharynx is clear and moist. No oropharyngeal exudate.  Eyes: Conjunctivae are normal. Pupils are equal, round, and reactive to light. Right eye exhibits no discharge. Left eye exhibits no discharge. No scleral icterus.  Neck: Normal range of motion. Neck supple. No tracheal deviation present. No thyromegaly present.  Cardiovascular: Normal rate, regular rhythm, normal heart sounds and intact distal pulses.  Exam reveals no gallop and no friction rub.   No murmur heard. Pulmonary/Chest: Effort normal and breath sounds normal. No accessory muscle usage. Not tachypneic. No respiratory distress. She has no decreased breath sounds. She has no wheezes. She has no rales. She exhibits no tenderness. Right breast exhibits no inverted nipple, no mass, no nipple discharge, no skin change and no tenderness. Left breast exhibits no inverted nipple, no mass, no nipple discharge, no skin change and no tenderness. Breasts are symmetrical.  Abdominal: Soft. Bowel sounds are normal. She exhibits no distension and no mass. There is no tenderness. There is no rebound and no guarding.  Musculoskeletal: Normal range of motion. She exhibits no edema and no tenderness.  Lymphadenopathy:    She has no cervical adenopathy.  Neurological: She is alert and oriented to person, place, and  time. No cranial nerve deficit. She exhibits normal muscle tone. Coordination normal.  Skin: Skin is warm and dry. No rash noted. She is not diaphoretic. No erythema. No pallor.  Psychiatric: Her behavior is normal. Judgment and thought content  normal. Her mood appears anxious.          Assessment & Plan:

## 2013-01-12 NOTE — Assessment & Plan Note (Signed)
General medical exam normal today including breast exam. PAP and pelvic deferred as normal 2012. Will check CMP and lipids with labs today. Encouraged healthy diet, regular exercise. Encouraged follow up as scheduled with psychologist for counseling for anxiety. Mammogram ordered.

## 2013-01-15 ENCOUNTER — Ambulatory Visit: Payer: Self-pay | Admitting: Internal Medicine

## 2013-01-29 ENCOUNTER — Encounter: Payer: Self-pay | Admitting: Emergency Medicine

## 2013-01-31 ENCOUNTER — Encounter: Payer: Self-pay | Admitting: Internal Medicine

## 2013-02-02 ENCOUNTER — Encounter: Payer: Self-pay | Admitting: Internal Medicine

## 2013-02-03 ENCOUNTER — Ambulatory Visit (INDEPENDENT_AMBULATORY_CARE_PROVIDER_SITE_OTHER): Payer: BC Managed Care – PPO | Admitting: Psychology

## 2013-02-03 DIAGNOSIS — F4323 Adjustment disorder with mixed anxiety and depressed mood: Secondary | ICD-10-CM

## 2013-03-03 ENCOUNTER — Ambulatory Visit (INDEPENDENT_AMBULATORY_CARE_PROVIDER_SITE_OTHER): Payer: BC Managed Care – PPO | Admitting: Psychology

## 2013-03-03 DIAGNOSIS — F4323 Adjustment disorder with mixed anxiety and depressed mood: Secondary | ICD-10-CM

## 2013-03-25 ENCOUNTER — Ambulatory Visit (INDEPENDENT_AMBULATORY_CARE_PROVIDER_SITE_OTHER): Payer: BC Managed Care – PPO | Admitting: Psychology

## 2013-03-25 DIAGNOSIS — F4323 Adjustment disorder with mixed anxiety and depressed mood: Secondary | ICD-10-CM

## 2013-04-01 DIAGNOSIS — E785 Hyperlipidemia, unspecified: Secondary | ICD-10-CM | POA: Insufficient documentation

## 2013-04-01 DIAGNOSIS — E559 Vitamin D deficiency, unspecified: Secondary | ICD-10-CM | POA: Insufficient documentation

## 2013-05-13 ENCOUNTER — Ambulatory Visit: Payer: Self-pay | Admitting: Urology

## 2013-06-23 ENCOUNTER — Encounter: Payer: Self-pay | Admitting: Internal Medicine

## 2013-06-23 ENCOUNTER — Telehealth: Payer: Self-pay | Admitting: Internal Medicine

## 2013-06-23 ENCOUNTER — Ambulatory Visit (INDEPENDENT_AMBULATORY_CARE_PROVIDER_SITE_OTHER): Payer: BC Managed Care – PPO | Admitting: Internal Medicine

## 2013-06-23 VITALS — BP 120/80 | HR 56 | Temp 98.2°F | Wt 157.0 lb

## 2013-06-23 DIAGNOSIS — D1739 Benign lipomatous neoplasm of skin and subcutaneous tissue of other sites: Secondary | ICD-10-CM

## 2013-06-23 DIAGNOSIS — K7581 Nonalcoholic steatohepatitis (NASH): Secondary | ICD-10-CM

## 2013-06-23 DIAGNOSIS — K5289 Other specified noninfective gastroenteritis and colitis: Secondary | ICD-10-CM

## 2013-06-23 DIAGNOSIS — D171 Benign lipomatous neoplasm of skin and subcutaneous tissue of trunk: Secondary | ICD-10-CM

## 2013-06-23 DIAGNOSIS — K7689 Other specified diseases of liver: Secondary | ICD-10-CM

## 2013-06-23 DIAGNOSIS — K529 Noninfective gastroenteritis and colitis, unspecified: Secondary | ICD-10-CM

## 2013-06-23 NOTE — Assessment & Plan Note (Signed)
Reviewed notes from [facility] today. Lesion most consistent with benign lipoma, however MRI will allow better evaluation for possibility of liposarcoma. Will follow.

## 2013-06-23 NOTE — Assessment & Plan Note (Signed)
Recent labs have been stable. Recommended q6 month [place] for evaluation of HCC. Continue follow up with Dr. Gerald Dexter.

## 2013-06-23 NOTE — Progress Notes (Signed)
Pre visit review using our clinic review tool, if applicable. No additional management support is needed unless otherwise documented below in the visit note. 

## 2013-06-23 NOTE — Progress Notes (Signed)
Subjective:    Patient ID: Jessica Spencer, female    DOB: 03/02/1966, 48 y.o.   MRN: [id number]  HPI 48YO female presents for hospital follow up.  Last Friday, developed abdominal pain, located mid abdomen. Abdomen felt "hot". Went to [facility] ED. Had fever, chills. Initially, no nausea or vomiting. But, then developed vomiting. Evaluation including CT abdomen which showed enteritis. Symptoms gradually improved, pt was discharged later that day. No treatment given. Cramping has improved. Appetite, however is poor. No diarrhea.  Also recently noted to have lipoma in lower abdominal wall on CT imaging performed by her hepatologist. Planning to have MRI for further evaluation.  Review of Systems  Constitutional: Positive for appetite change. Negative for fever, chills, fatigue and unexpected weight change.  HENT: Negative for congestion, ear pain, sinus pressure, sore throat, trouble swallowing and voice change.   Eyes: Negative for visual disturbance.  Respiratory: Negative for cough, shortness of breath, wheezing and stridor.   Cardiovascular: Negative for chest pain, palpitations and leg swelling.  Gastrointestinal: Positive for nausea, vomiting (now resolved), abdominal pain and constipation. Negative for diarrhea, blood in stool, abdominal distention and anal bleeding.  Genitourinary: Negative for dysuria and flank pain.  Musculoskeletal: Negative for arthralgias, gait problem, myalgias and neck pain.  Skin: Negative for color change and rash.  Neurological: Negative for dizziness and headaches.  Hematological: Negative for adenopathy. Does not bruise/bleed easily.  Psychiatric/Behavioral: Negative for suicidal ideas, sleep disturbance and dysphoric mood. The patient is nervous/anxious.        Objective:    BP 120/80  Pulse 56  Temp(Src) 98.2 F (36.8 C) (Oral)  Wt 157 lb (71.215 kg)  SpO2 97% Physical Exam  Constitutional: She is oriented to person, place, and time. She  appears well-developed and well-nourished. No distress.  HENT:  Head: Normocephalic and atraumatic.  Right Ear: External ear normal.  Left Ear: External ear normal.  Nose: Nose normal.  Mouth/Throat: Oropharynx is clear and moist. No oropharyngeal exudate.  Eyes: Conjunctivae are normal. Pupils are equal, round, and reactive to light. Right eye exhibits no discharge. Left eye exhibits no discharge. No scleral icterus.  Neck: Normal range of motion. Neck supple. No tracheal deviation present. No thyromegaly present.  Cardiovascular: Normal rate, regular rhythm, normal heart sounds and intact distal pulses.  Exam reveals no gallop and no friction rub.   No murmur heard. Pulmonary/Chest: Effort normal and breath sounds normal. No accessory muscle usage. Not tachypneic. No respiratory distress. She has no decreased breath sounds. She has no wheezes. She has no rhonchi. She has no rales. She exhibits no tenderness.  Abdominal: Soft. Bowel sounds are normal. She exhibits no distension and no mass. There is no tenderness. There is no rebound and no guarding.  Musculoskeletal: Normal range of motion. She exhibits no edema and no tenderness.  Lymphadenopathy:    She has no cervical adenopathy.  Neurological: She is alert and oriented to person, place, and time. No cranial nerve deficit. She exhibits normal muscle tone. Coordination normal.  Skin: Skin is warm and dry. No rash noted. She is not diaphoretic. No erythema. No pallor.  Psychiatric: Her behavior is normal. Judgment and thought content normal. Her mood appears anxious.          Assessment & Plan:   Problem List Items Addressed This Visit   Enteritis - Primary     Recent viral enteritis. Reviewed notes from [facility] ED including recent CT. Symptoms improving. Tolerating full diet. Exam is normal. Will  continue to monitor.    Lipoma of abdominal wall     Reviewed notes from [facility] today. Lesion most consistent with benign lipoma, however MRI  will allow better evaluation for possibility of liposarcoma. Will follow.    NASH (nonalcoholic steatohepatitis)     Recent labs have been stable. Recommended q6 month [place] for evaluation of HCC. Continue follow up with Dr. Gerald Dexter.        Return in about 6 months (around 12/24/2013) for Follow up, Physical.

## 2013-06-23 NOTE — Telephone Encounter (Signed)
Follow up.

## 2013-06-23 NOTE — Assessment & Plan Note (Signed)
Recent viral enteritis. Reviewed notes from [facility] ED including recent CT. Symptoms improving. Tolerating full diet. Exam is normal. Will continue to monitor.

## 2013-07-13 ENCOUNTER — Ambulatory Visit: Payer: BC Managed Care – PPO | Admitting: Internal Medicine

## 2013-07-13 ENCOUNTER — Encounter: Payer: Self-pay | Admitting: Internal Medicine

## 2013-08-11 ENCOUNTER — Telehealth: Payer: Self-pay | Admitting: Internal Medicine

## 2013-08-11 NOTE — Telephone Encounter (Signed)
OK. Will need to see Tullo tomorrow, as I have meeting today 4pm.

## 2013-08-11 NOTE — Telephone Encounter (Signed)
Patient Information:  Caller Name: [place]  Phone: [phone]  Patient: Jessica Spencer, Jessica Spencer  Gender: Female  DOB: 1965-10-21  Age: 48 Years  PCP: Ronette Deter (Adults only)  Pregnant: No  Office Follow Up:  Does the office need to follow up with this patient?: Yes  Instructions For The Office: Advised pt of need for appt today, office is booked (no appts at [facility], offered appt at [place] but too far for pt to travel).   Appt scheduled 08/12/13 at 1345 with Dr Derrel Nip per pt's request.  Advised pt a message would be sent to Dr Gilford Rile due to "see today" disposition and no available appts to make sure pt is ok to wait until tomorrow or if any further advice in meantime to help with rash/itching.  Symptoms  Reason For Call & Symptoms: 08/05/13 bowel obstruction surgery.  08/10/13 itchy rash appeared.  Called [facility], advised to take [facility] and call surgeon today.  08/11/13 spoke with surgeon, no answers, suggested to take Benedryl and if no better within 24 hours to see PCP.  Rash is mainly on torso/sides, buttocks.  Itchy.  Slightly raised at times.  Red blotches, uneven edges.    Pt is not taking the nausea and pain medications.   Only medication she has taken is Simethicone since surgery (pt has previously taken this with no side effects in past).   Benedryl provides no relief other than may slightly help with itching.  Reviewed Health History In EMR: Yes  Reviewed Medications In EMR: Yes  Reviewed Allergies In EMR: Yes  Reviewed Surgeries / Procedures: Yes  Date of Onset of Symptoms: 08/10/2013  Treatments Tried: Benedryl  Treatments Tried Worked: No OB / GYN:  LMP: Unknown  Guideline(s) Used:  Rash - Widespread on Drugs - Drug Reaction  Disposition Per Guideline:   See Today in Office  Reason For Disposition Reached:   Hives  Advice Given:  Oral Antihistamine Medication for Itching:  Take an antihistamine like diphenhydramine (Benadryl) for widespread rashes that itch. The  adult dosage of Benadryl is 25-50 mg by mouth 4 times daily.  Call Back If:  You become worse.  Patient Will Follow Care Advice:  YES  Appointment Scheduled:  08/12/2013 13:45:00 Appointment Scheduled Provider:  Deborra Medina (Adults only)

## 2013-08-11 NOTE — Telephone Encounter (Signed)
Pt.notified

## 2013-08-11 NOTE — Telephone Encounter (Signed)
Please advise 

## 2013-08-12 ENCOUNTER — Encounter: Payer: Self-pay | Admitting: Internal Medicine

## 2013-08-12 ENCOUNTER — Ambulatory Visit (INDEPENDENT_AMBULATORY_CARE_PROVIDER_SITE_OTHER): Payer: BC Managed Care – PPO | Admitting: Internal Medicine

## 2013-08-12 VITALS — BP 122/88 | HR 96 | Temp 98.1°F | Resp 14 | Wt 149.2 lb

## 2013-08-12 DIAGNOSIS — R21 Rash and other nonspecific skin eruption: Secondary | ICD-10-CM

## 2013-08-12 LAB — COMPREHENSIVE METABOLIC PANEL
ALBUMIN: 4.1 g/dL (ref 3.5–5.2)
ALT: 27 U/L (ref 0–35)
AST: 28 U/L (ref 0–37)
Alkaline Phosphatase: 68 U/L (ref 39–117)
BILIRUBIN TOTAL: 2.8 mg/dL — AB (ref 0.3–1.2)
BUN: 17 mg/dL (ref 6–23)
CALCIUM: 9.9 mg/dL (ref 8.4–10.5)
CHLORIDE: 95 meq/L — AB (ref 96–112)
CO2: 28 meq/L (ref 19–32)
Creatinine, Ser: 0.6 mg/dL (ref 0.4–1.2)
GFR: 108.86 mL/min (ref >60.00)
GLUCOSE: 92 mg/dL (ref 70–99)
Potassium: 3.8 mEq/L (ref 3.5–5.1)
SODIUM: 135 meq/L (ref 135–145)
TOTAL PROTEIN: 7.9 g/dL (ref 6.0–8.3)

## 2013-08-12 LAB — CBC WITH DIFFERENTIAL/PLATELET
BASOS PCT: 0.2 % (ref 0.0–3.0)
Basophils Absolute: 0 10*3/uL (ref 0.0–0.1)
EOS PCT: 2.4 % (ref 0.0–5.0)
Eosinophils Absolute: 0.1 10*3/uL (ref 0.0–0.7)
HEMATOCRIT: 40.2 % (ref 36.0–46.0)
Hemoglobin: 13.9 g/dL (ref 12.0–15.0)
LYMPHS ABS: 1.2 10*3/uL (ref 0.7–4.0)
Lymphocytes Relative: 19.3 % (ref 12.0–46.0)
MCHC: 34.6 g/dL (ref 30.0–36.0)
MCV: 93.8 fl (ref 78.0–100.0)
MONO ABS: 0.4 10*3/uL (ref 0.1–1.0)
MONOS PCT: 5.8 % (ref 3.0–12.0)
NEUTROS PCT: 72.3 % (ref 43.0–77.0)
Neutro Abs: 4.3 10*3/uL (ref 1.4–7.7)
PLATELETS: 311 10*3/uL (ref 150.0–400.0)
RBC: 4.29 Mil/uL (ref 3.87–5.11)
RDW: 12.7 % (ref 11.5–14.6)
WBC: 6 10*3/uL (ref 4.5–10.5)

## 2013-08-12 LAB — SEDIMENTATION RATE: SED RATE: 73 mm/h — AB (ref 0–22)

## 2013-08-12 NOTE — Progress Notes (Signed)
Patient ID: Meleane Selinger, female   DOB: 23-Jan-1966, 48 y.o.   MRN: [id number]  Patient Active Problem List   Diagnosis Date Noted  . Rash and nonspecific skin eruption 08/14/2013  . Enteritis 06/23/2013  . 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 Routine general medical examination at a health care facility 01/12/2013  . Myalgia and myositis 11/21/2012  . Generalized anxiety disorder 10/03/2012  . Neck pain 08/29/2012  . Bunion of left foot 07/06/2011  . NASH (nonalcoholic steatohepatitis) 02/14/2011  . Constipation 02/14/2011  . MIGRAINE WITH AURA 11/12/2006  . IBS 11/12/2006  . HEMATURIA, CHRONIC 11/12/2006  . INSOMNIA 11/12/2006    Subjective:  CC:   Chief Complaint  Patient presents with  . Acute Visit    rash hives no new medications except simethicone? Bowel obstruction surgery 1 week ago.    HPI:   Clayton Jarmon is a 48 y.o. female who presents for Acute onset of rash.  HX:  Patient is 7 days post op from open abd surgery last Wednesday at [facility] for SBO secondary to adhesions from prior abd surgeries (hysterectomy,  And serial Oophorectomies,  3 prior surgeries ). Was discharged on Sunday on New prn medications oxycodone, odansetron and simethicone  for gas.  simethicone started on Saturday for prn bloating, but has not taken any in 48 hours.  Has been staying at mother's home since discharge.   Started developing rash on abdomen,  Buttocks, and  under breasts  Monday night.   Itching under breasts and on bottock but nowhere else  .  Still having abd surgical pain but not bad enough to use oxycodone.  Worred that the rash is from her fatty liver.  Told by surgery to follow up with PCP.    Past Medical History  Diagnosis Date  . NASH (nonalcoholic steatohepatitis)     Followed by Dr. Gerald Dexter  . Hypertension   . Hyperlipidemia     Past Surgical History  Procedure Laterality Date  . Carpal tunnel repair      Onset date 03/2008       The following  portions of the patient's history were reviewed and updated as appropriate: Allergies, current medications, and problem list.    Review of Systems:   Patient denies headache, fevers, malaise, unintentional weight loss, skin rash, eye pain, sinus congestion and sinus pain, sore throat, dysphagia,  hemoptysis , cough, dyspnea, wheezing, chest pain, palpitations, orthopnea, edema, abdominal pain, nausea, melena, diarrhea, constipation, flank pain, dysuria, hematuria, urinary  Frequency, nocturia, numbness, tingling, seizures,  Focal weakness, Loss of consciousness,  Tremor, insomnia, depression, anxiety, and suicidal ideation.     History   Social History  . Marital Status: Married    Spouse Name: N/A    Number of Children: N/A  . Years of Education: N/A   Occupational History  . Not on file.   Social History Main Topics  . Smoking status: Never Smoker   . Smokeless tobacco: Never Used  . Alcohol Use: No  . Drug Use: No  . Sexual Activity: Not on file   Other Topics Concern  . Not on file   Social History Narrative   Lives in [place]. Diet - healthy, low fat. Exercises regularly.    Objective:  Filed Vitals:   08/12/13 1349  BP: 122/88  Pulse: 96  Temp: 98.1 F (36.7 C)  Resp: 14     General appearance: alert, cooperative and appears stated age Ears: normal TM's and external ear canals both  ears Throat: lips, mucosa, and tongue normal; teeth and gums normal Neck: no adenopathy, no carotid bruit, supple, symmetrical, trachea midline and thyroid not enlarged, symmetric, no tenderness/mass/nodules Back: symmetric, no curvature. ROM normal. No CVA tenderness. Lungs: clear to auscultation bilaterally Heart: regular rate and rhythm, S1, S2 normal, no murmur, click, rub or gallop Abdomen: soft, non-tender; bowel sounds normal; no masses,  no organomegaly Pulses: 2+ and symmetric Skin: fine  papular  like sandpaper,  Pink,  Warm affecting lateral sides of abdomen,  sparing incision,  Buttocks and anterior thighs     Lymph nodes: Cervical, supraclavicular, and axillary nodes normal.  Assessment and Plan:  Rash and nonspecific skin eruption Etiology unclear.  Liver enzymes are normal.  Rash is warm bur CBC is normal.  Continue benadryl. May be contact allergy to mother's detergent, etc. Recheck in 48 hours  Lab Results  Component Value Date   ESRSEDRATE 73* 08/12/2013    Lab Results  Component Value Date   WBC 6.0 08/12/2013   HGB 13.9 08/12/2013   HCT 40.2 08/12/2013   MCV 93.8 08/12/2013   PLT 311.0 08/12/2013   Lab Results  Component Value Date   ALT 27 08/12/2013   AST 28 08/12/2013   ALKPHOS 68 08/12/2013   BILITOT 2.8* 08/12/2013      Updated Medication List Outpatient Encounter Prescriptions as of 08/12/2013  Medication Sig  . cholecalciferol (VITAMIN D) 1000 UNITS tablet Take 1,000 Units by mouth 2 (two) times daily.    . polyethylene glycol (MIRALAX / GLYCOLAX) packet Take 17 g by mouth daily.  . vitamin E 800 UNIT capsule Take 800 Units by mouth daily.       Orders Placed This Encounter  Procedures  . CBC with Differential  . Sedimentation rate  . Comp Met (CMET)    No Follow-up on file.

## 2013-08-12 NOTE — Patient Instructions (Signed)
Your rash may be due to a detergent or a reactio nto a medication you took while hospitlaized  Change benadryl to claritin 10 mg daily or allegra (fesofenadine) 10 mg daily  Add famotidine (pepcid)  20 mg every 6 hours   Return on Friday to make sure rash is improving   If you develop fevers (T > 100.4) call us immediately

## 2013-08-12 NOTE — Progress Notes (Signed)
Pre-visit discussion using our clinic review tool. No additional management support is needed unless otherwise documented below in the visit note.  

## 2013-08-13 ENCOUNTER — Encounter: Payer: Self-pay | Admitting: Internal Medicine

## 2013-08-14 ENCOUNTER — Ambulatory Visit (INDEPENDENT_AMBULATORY_CARE_PROVIDER_SITE_OTHER): Payer: BC Managed Care – PPO | Admitting: Internal Medicine

## 2013-08-14 ENCOUNTER — Encounter: Payer: Self-pay | Admitting: Internal Medicine

## 2013-08-14 VITALS — BP 126/84 | HR 86 | Temp 97.5°F | Resp 16 | Wt 148.5 lb

## 2013-08-14 DIAGNOSIS — R21 Rash and other nonspecific skin eruption: Secondary | ICD-10-CM

## 2013-08-14 NOTE — Progress Notes (Signed)
Pre-visit discussion using our clinic review tool. No additional management support is needed unless otherwise documented below in the visit note.  

## 2013-08-14 NOTE — Assessment & Plan Note (Addendum)
Etiology unclear.  Liver enzymes are normal.  Rash is warm bur CBC is normal.  Continue benadryl. May be contact allergy to mother's detergent, etc. Recheck in 48 hours  Lab Results  Component Value Date   ESRSEDRATE 73* 08/12/2013    Lab Results  Component Value Date   WBC 6.0 08/12/2013   HGB 13.9 08/12/2013   HCT 40.2 08/12/2013   MCV 93.8 08/12/2013   PLT 311.0 08/12/2013   Lab Results  Component Value Date   ALT 27 08/12/2013   AST 28 08/12/2013   ALKPHOS 68 08/12/2013   BILITOT 2.8* 08/12/2013

## 2013-08-16 ENCOUNTER — Encounter: Payer: Self-pay | Admitting: Internal Medicine

## 2013-08-16 NOTE — Progress Notes (Signed)
Patient ID: Jessica Spencer, female   DOB: 01-Mar-1966, 48 y.o.   MRN: [id number]  Patient Active Problem List   Diagnosis Date Noted  . Rash and nonspecific skin eruption 08/14/2013  . Enteritis 06/23/2013  . 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 Routine general medical examination at a health care facility 01/12/2013  . Myalgia and myositis 11/21/2012  . Generalized anxiety disorder 10/03/2012  . Neck pain 08/29/2012  . Bunion of left foot 07/06/2011  . NASH (nonalcoholic steatohepatitis) 02/14/2011  . Constipation 02/14/2011  . MIGRAINE WITH AURA 11/12/2006  . IBS 11/12/2006  . HEMATURIA, CHRONIC 11/12/2006  . INSOMNIA 11/12/2006    Subjective:  CC:   Chief Complaint  Patient presents with  . Follow-up    HPI:   Jessica Spencer is a 48 y.o. female who presents for Follow up on diffuse papular pruritic rash.  The rash is improving but still present on her lateral abdomen bilaterally but more faint . The rash has cleared from her buttocks and thighs. .  It has not spread to other body parts. She is using benadryl at night and allegra during the day. She has discarded the clothes that she bought after discharge from hospital .    Past Medical History  Diagnosis Date  . NASH (nonalcoholic steatohepatitis)     Followed by Dr. Gerald Dexter  . Hypertension   . Hyperlipidemia     Past Surgical History  Procedure Laterality Date  . Carpal tunnel repair      Onset date 03/2008       The following portions of the patient's history were reviewed and updated as appropriate: Allergies, current medications, and problem list.    Review of Systems:   Patient denies headache, fevers, malaise, unintentional weight loss, skin rash, eye pain, sinus congestion and sinus pain, sore throat, dysphagia,  hemoptysis , cough, dyspnea, wheezing, chest pain, palpitations, orthopnea, edema, abdominal pain, nausea, melena, diarrhea, constipation, flank pain, dysuria, hematuria, urinary   Frequency, nocturia, numbness, tingling, seizures,  Focal weakness, Loss of consciousness,  Tremor, insomnia, depression, anxiety, and suicidal ideation.     History   Social History  . Marital Status: Married    Spouse Name: N/A    Number of Children: N/A  . Years of Education: N/A   Occupational History  . Not on file.   Social History Main Topics  . Smoking status: Never Smoker   . Smokeless tobacco: Never Used  . Alcohol Use: No  . Drug Use: No  . Sexual Activity: Not on file   Other Topics Concern  . Not on file   Social History Narrative   Lives in [place]. Diet - healthy, low fat. Exercises regularly.    Objective:  Filed Vitals:   08/14/13 1139  BP: 126/84  Pulse: 86  Temp: 97.5 F (36.4 C)  Resp: 16     General appearance: alert, cooperative and appears stated age Ears: normal TM's and external ear canals both ears Lungs: clear to auscultation bilaterally Heart: regular rate and rhythm, S1, S2 normal, no murmur, click, rub or gallop Abdomen: soft, non-tender; bowel sounds normal; no masses,  no organomegaly Pulses: 2+ and symmetric Skin: resolving papular erythematous rash.  Lymph nodes: Cervical, supraclavicular, and axillary nodes normal.  Assessment and Plan:  Rash and nonspecific skin eruption Improving without steroids.  Suspected to be secondary to use of dilaudid during hospitalization, since she remembers itching during the IV use.  Advised to continue prn benadryl,  Can add benadryl  ointment.    Updated Medication List Outpatient Encounter Prescriptions as of 08/14/2013  Medication Sig  . cholecalciferol (VITAMIN D) 1000 UNITS tablet Take 1,000 Units by mouth 2 (two) times daily.    . polyethylene glycol (MIRALAX / GLYCOLAX) packet Take 17 g by mouth daily.  . vitamin E 800 UNIT capsule Take 800 Units by mouth daily.       No orders of the defined types were placed in this encounter.    No Follow-up on file.

## 2013-08-16 NOTE — Assessment & Plan Note (Addendum)
Improving without steroids.  Suspected to be secondary to use of dilaudid during hospitalization, since she remembers itching during the IV use.  Advised to continue prn benadryl,  Can add benadryl ointment.

## 2013-09-02 ENCOUNTER — Encounter: Payer: Self-pay | Admitting: Internal Medicine

## 2013-09-04 ENCOUNTER — Ambulatory Visit: Payer: BC Managed Care – PPO | Admitting: Adult Health

## 2013-09-11 ENCOUNTER — Ambulatory Visit (INDEPENDENT_AMBULATORY_CARE_PROVIDER_SITE_OTHER): Payer: BC Managed Care – PPO | Admitting: Internal Medicine

## 2013-09-11 ENCOUNTER — Telehealth: Payer: Self-pay | Admitting: Internal Medicine

## 2013-09-11 ENCOUNTER — Encounter: Payer: Self-pay | Admitting: Internal Medicine

## 2013-09-11 VITALS — BP 102/74 | HR 58 | Temp 98.3°F | Wt 143.5 lb

## 2013-09-11 DIAGNOSIS — K7689 Other specified diseases of liver: Secondary | ICD-10-CM

## 2013-09-11 DIAGNOSIS — R21 Rash and other nonspecific skin eruption: Secondary | ICD-10-CM

## 2013-09-11 DIAGNOSIS — K7581 Nonalcoholic steatohepatitis (NASH): Secondary | ICD-10-CM

## 2013-09-11 LAB — COMPREHENSIVE METABOLIC PANEL
ALK PHOS: 56 U/L (ref 39–117)
ALT: 11 U/L (ref 0–35)
AST: 19 U/L (ref 0–37)
Albumin: 4.6 g/dL (ref 3.5–5.2)
BUN: 8 mg/dL (ref 6–23)
CALCIUM: 10.4 mg/dL (ref 8.4–10.5)
CHLORIDE: 103 meq/L (ref 96–112)
CO2: 31 mEq/L (ref 19–32)
Creatinine, Ser: 0.6 mg/dL (ref 0.4–1.2)
GFR: 115.24 mL/min (ref >60.00)
Glucose, Bld: 85 mg/dL (ref 70–99)
POTASSIUM: 3.6 meq/L (ref 3.5–5.1)
Sodium: 144 mEq/L (ref 135–145)
Total Bilirubin: 2.7 mg/dL — ABNORMAL HIGH (ref 0.2–1.2)
Total Protein: 7.6 g/dL (ref 6.0–8.3)

## 2013-09-11 LAB — CBC WITH DIFFERENTIAL/PLATELET
BASOS PCT: 0.5 % (ref 0.0–3.0)
Basophils Absolute: 0 10*3/uL (ref 0.0–0.1)
EOS PCT: 1.5 % (ref 0.0–5.0)
Eosinophils Absolute: 0.1 10*3/uL (ref 0.0–0.7)
HEMATOCRIT: 40 % (ref 36.0–46.0)
Hemoglobin: 13.8 g/dL (ref 12.0–15.0)
LYMPHS ABS: 1.3 10*3/uL (ref 0.7–4.0)
Lymphocytes Relative: 31.5 % (ref 12.0–46.0)
MCHC: 34.5 g/dL (ref 30.0–36.0)
MCV: 93.3 fl (ref 78.0–100.0)
MONO ABS: 0.3 10*3/uL (ref 0.1–1.0)
Monocytes Relative: 6.7 % (ref 3.0–12.0)
NEUTROS ABS: 2.4 10*3/uL (ref 1.4–7.7)
NEUTROS PCT: 59.8 % (ref 43.0–77.0)
Platelets: 173 10*3/uL (ref 150.0–400.0)
RBC: 4.28 Mil/uL (ref 3.87–5.11)
RDW: 13.3 % (ref 11.5–15.5)
WBC: 4.1 10*3/uL (ref 4.0–10.5)

## 2013-09-11 LAB — TSH: TSH: 0.62 u[IU]/mL (ref 0.35–4.50)

## 2013-09-11 LAB — SEDIMENTATION RATE: Sed Rate: 29 mm/hr — ABNORMAL HIGH (ref 0–22)

## 2013-09-11 LAB — VITAMIN B12: Vitamin B-12: 434 pg/mL (ref 211–911)

## 2013-09-11 NOTE — Assessment & Plan Note (Addendum)
Rash has resolved but she continues to have some pruritis. Likely worsened by significant anxiety. Exam is normal. Suspect rash was secondary to medications given during surgery, possibly Fentanyl. Offered reassurance today. Will recheck labs including CBC, CMP, TSH, LDH, B12. Follow up 1 week.  Over 28mn of which >50% spent in face-to-face contact with patient discussing plan of care

## 2013-09-11 NOTE — Progress Notes (Signed)
Pre visit review using our clinic review tool, if applicable. No additional management support is needed unless otherwise documented below in the visit note. 

## 2013-09-11 NOTE — Assessment & Plan Note (Signed)
Reassured pt that rash most likely not related to NASH. Reviewed recent LFTs from [place] and here, which were stable. Recheck LFTs today. Follow up 1 week.

## 2013-09-11 NOTE — Progress Notes (Signed)
Subjective:    Patient ID: Jessica Spencer, female    DOB: June 01, 1965, 48 y.o.   MRN: [id number]  HPI 48YO female presents for acute visit.  Had surgery 4/22 and then 2 days later developed rash under breasts, and abdomen, and on top of thighs. Rash was itching and she took benadryl with minimal improvement. Felt jittery on Benadryl. Was seen 5/1 by Dr. Derrel Nip. Started Claritin and Pepcid. Rash seemed to improve. Over the next week, rash improved, itching persistent. Stopped medication. Rash seemed to improve. Then, had recurrent itching next week, over arms and face. Did not start medication back. Seen by surgeon May 21st. Staples were removed. Surgeon felt rash was reaction to iodine prep initially. Recommended giving rash a few more weeks. Monday was sitting outside, has had "pin-prick" sensation ever since. No recent fever, chills. Rash is completely resolved, but still feels itchy diffusely over her body.   Review of Systems  Constitutional: Negative for fever, chills, appetite change, fatigue and unexpected weight change.  HENT: Negative for congestion, ear pain, sinus pressure, sore throat, trouble swallowing and voice change.   Eyes: Negative for visual disturbance.  Respiratory: Negative for cough, shortness of breath, wheezing and stridor.   Cardiovascular: Negative for chest pain, palpitations and leg swelling.  Gastrointestinal: Negative for nausea, vomiting, abdominal pain, diarrhea, constipation, blood in stool, abdominal distention and anal bleeding.  Genitourinary: Negative for dysuria and flank pain.  Musculoskeletal: Negative for arthralgias, gait problem, myalgias and neck pain.  Skin: Positive for rash. Negative for color change.  Neurological: Negative for dizziness and headaches.  Hematological: Negative for adenopathy. Does not bruise/bleed easily.  Psychiatric/Behavioral: Negative for suicidal ideas, sleep disturbance and dysphoric mood. The patient is  nervous/anxious.        Objective:    BP 102/74  Pulse 58  Temp(Src) 98.3 F (36.8 C) (Oral)  Wt 143 lb 8 oz (65.091 kg)  SpO2 98% Physical Exam  Constitutional: She is oriented to person, place, and time. She appears well-developed and well-nourished. No distress.  HENT:  Head: Normocephalic and atraumatic.  Right Ear: External ear normal.  Left Ear: External ear normal.  Nose: Nose normal.  Mouth/Throat: Oropharynx is clear and moist. No oropharyngeal exudate.  Eyes: Conjunctivae are normal. Pupils are equal, round, and reactive to light. Right eye exhibits no discharge. Left eye exhibits no discharge. No scleral icterus.  Neck: Normal range of motion. Neck supple. No tracheal deviation present. No thyromegaly present.  Cardiovascular: Normal rate, regular rhythm, normal heart sounds and intact distal pulses.  Exam reveals no gallop and no friction rub.   No murmur heard. Pulmonary/Chest: Effort normal and breath sounds normal. No accessory muscle usage. Not tachypneic. No respiratory distress. She has no decreased breath sounds. She has no wheezes. She has no rhonchi. She has no rales. She exhibits no tenderness.  Musculoskeletal: Normal range of motion. She exhibits no edema and no tenderness.  Lymphadenopathy:    She has no cervical adenopathy.  Neurological: She is alert and oriented to person, place, and time. No cranial nerve deficit. She exhibits normal muscle tone. Coordination normal.  Skin: Skin is warm and dry. No rash noted. She is not diaphoretic. No erythema. No pallor.  Psychiatric: Her behavior is normal. Judgment and thought content normal. Her mood appears anxious.          Assessment & Plan:   Problem List Items Addressed This Visit     Unprioritized   NASH (nonalcoholic steatohepatitis)  Reassured pt that rash most likely not related to NASH. Reviewed recent LFTs from [place] and here, which were stable. Recheck LFTs today. Follow up 1 week.    Rash  and nonspecific skin eruption - Primary     Rash has resolved but she continues to have some pruritis. Likely worsened by significant anxiety. Exam is normal. Suspect rash was secondary to medications given during surgery, possibly Fentanyl. Offered reassurance today. Will recheck labs including CBC, CMP, TSH, LDH, B12. Follow up 1 week.  Over 55mn of which >50% spent in face-to-face contact with patient discussing plan of care     Relevant Orders      Comprehensive metabolic panel      TSH      CBC w/Diff      Sed Rate (ESR)      Lactate Dehydrogenase (LDH)      B12       Return in about 1 week (around 09/18/2013).

## 2013-09-11 NOTE — Telephone Encounter (Signed)
Dr. Gilford Rile request to see the patient back in a week. The only appt that i can schedule her for is 6/10 @1230 . Please advise if this will be ok/msn

## 2013-09-12 LAB — LACTATE DEHYDROGENASE: LDH: 139 U/L (ref 94–250)

## 2013-09-15 ENCOUNTER — Ambulatory Visit (INDEPENDENT_AMBULATORY_CARE_PROVIDER_SITE_OTHER): Payer: BC Managed Care – PPO | Admitting: Internal Medicine

## 2013-09-15 ENCOUNTER — Encounter: Payer: Self-pay | Admitting: Internal Medicine

## 2013-09-15 VITALS — BP 120/62 | HR 57 | Temp 97.8°F | Ht 66.0 in | Wt 145.5 lb

## 2013-09-15 DIAGNOSIS — K5900 Constipation, unspecified: Secondary | ICD-10-CM

## 2013-09-15 DIAGNOSIS — R21 Rash and other nonspecific skin eruption: Secondary | ICD-10-CM

## 2013-09-15 NOTE — Assessment & Plan Note (Signed)
Symptoms have completely resolved. Suspect allergy to Fentanyl with recent surgery. Follow up prn.

## 2013-09-15 NOTE — Assessment & Plan Note (Signed)
Chronic constipation. Improved with prn Miralax. Will continue.

## 2013-09-15 NOTE — Progress Notes (Signed)
   Subjective:    Patient ID: Jessica Spencer, female    DOB: [birth year], 48 y.o.   MRN: [id number]  HPI 49YO female presents for follow up. Recently seen in clinic for diffuse pruritis.  She reports symptoms have resolved. Lab evaluation was normal. She is no longer taking any antihistamines. She has started to have mild constipation. She has been taking occasional Miralax. No abdominal pain, abdominal distension.   Review of Systems  Constitutional: Negative for fever, chills and fatigue.  Respiratory: Negative for shortness of breath.   Cardiovascular: Negative for chest pain.  Gastrointestinal: Positive for constipation. Negative for nausea, vomiting, abdominal pain, diarrhea, blood in stool, abdominal distention, anal bleeding and rectal pain.  Skin: Negative for color change and rash.       Objective:    BP 120/62  Pulse 57  Temp(Src) 97.8 F (36.6 C) (Oral)  Ht 5' 6"  (1.676 m)  Wt 145 lb 8 oz (65.998 kg)  BMI 23.50 kg/m2  SpO2 98% Physical Exam  Constitutional: She is oriented to person, place, and time. She appears well-developed and well-nourished. No distress.  HENT:  Head: Normocephalic and atraumatic.  Right Ear: External ear normal.  Left Ear: External ear normal.  Nose: Nose normal.  Mouth/Throat: Oropharynx is clear and moist. No oropharyngeal exudate.  Eyes: Conjunctivae are normal. Pupils are equal, round, and reactive to light. Right eye exhibits no discharge. Left eye exhibits no discharge. No scleral icterus.  Neck: Normal range of motion. Neck supple. No tracheal deviation present. No thyromegaly present.  Cardiovascular: Normal rate, regular rhythm, normal heart sounds and intact distal pulses.  Exam reveals no gallop and no friction rub.   No murmur heard. Pulmonary/Chest: Effort normal and breath sounds normal. No accessory muscle usage. Not tachypneic. No respiratory distress. She has no decreased breath sounds. She has no wheezes. She has no rhonchi.  She has no rales. She exhibits no tenderness.  Musculoskeletal: Normal range of motion. She exhibits no edema and no tenderness.  Lymphadenopathy:    She has no cervical adenopathy.  Neurological: She is alert and oriented to person, place, and time. No cranial nerve deficit. She exhibits normal muscle tone. Coordination normal.  Skin: Skin is warm and dry. No rash noted. She is not diaphoretic. No erythema. No pallor.  Psychiatric: She has a normal mood and affect. Her behavior is normal. Judgment and thought content normal.          Assessment & Plan:   Problem List Items Addressed This Visit     Unprioritized   Rash and nonspecific skin eruption     Symptoms have completely resolved. Suspect allergy to Fentanyl with recent surgery. Follow up prn.    Unspecified constipation - Primary     Chronic constipation. Improved with prn Miralax. Will continue.        Return if symptoms worsen or fail to improve.

## 2013-09-15 NOTE — Progress Notes (Signed)
Pre visit review using our clinic review tool, if applicable. No additional management support is needed unless otherwise documented below in the visit note. 

## 2013-09-23 ENCOUNTER — Ambulatory Visit: Payer: BC Managed Care – PPO | Admitting: Internal Medicine

## 2013-10-03 ENCOUNTER — Encounter: Payer: Self-pay | Admitting: Internal Medicine

## 2013-10-07 ENCOUNTER — Encounter: Payer: Self-pay | Admitting: Internal Medicine

## 2013-10-07 DIAGNOSIS — L309 Dermatitis, unspecified: Secondary | ICD-10-CM

## 2013-12-28 ENCOUNTER — Ambulatory Visit (INDEPENDENT_AMBULATORY_CARE_PROVIDER_SITE_OTHER): Payer: BC Managed Care – PPO | Admitting: Internal Medicine

## 2013-12-28 ENCOUNTER — Encounter: Payer: Self-pay | Admitting: Internal Medicine

## 2013-12-28 ENCOUNTER — Other Ambulatory Visit (HOSPITAL_COMMUNITY)
Admission: RE | Admit: 2013-12-28 | Discharge: 2013-12-28 | Disposition: A | Discharge status: Home / Self Care | Payer: BC Managed Care – PPO | Source: Ambulatory Visit | Attending: Internal Medicine | Admitting: Internal Medicine

## 2013-12-28 VITALS — BP 120/80 | HR 63 | Temp 97.9°F | Ht 66.0 in | Wt 151.0 lb

## 2013-12-28 DIAGNOSIS — Z1151 Encounter for screening for human papillomavirus (HPV): Secondary | ICD-10-CM | POA: Insufficient documentation

## 2013-12-28 DIAGNOSIS — Z0000 Encounter for general adult medical examination without abnormal findings: Secondary | ICD-10-CM

## 2013-12-28 DIAGNOSIS — Z01419 Encounter for gynecological examination (general) (routine) without abnormal findings: Secondary | ICD-10-CM | POA: Insufficient documentation

## 2013-12-28 LAB — CBC WITH DIFFERENTIAL/PLATELET
BASOS PCT: 0.2 % (ref 0.0–3.0)
Basophils Absolute: 0 10*3/uL (ref 0.0–0.1)
Eosinophils Absolute: 0.1 10*3/uL (ref 0.0–0.7)
Eosinophils Relative: 1.5 % (ref 0.0–5.0)
HEMATOCRIT: 39.9 % (ref 36.0–46.0)
HEMOGLOBIN: 13.7 g/dL (ref 12.0–15.0)
Lymphocytes Relative: 28.6 % (ref 12.0–46.0)
Lymphs Abs: 1.2 10*3/uL (ref 0.7–4.0)
MCHC: 34.3 g/dL (ref 30.0–36.0)
MCV: 94.9 fl (ref 78.0–100.0)
MONO ABS: 0.2 10*3/uL (ref 0.1–1.0)
Monocytes Relative: 5.6 % (ref 3.0–12.0)
NEUTROS ABS: 2.7 10*3/uL (ref 1.4–7.7)
Neutrophils Relative %: 64.1 % (ref 43.0–77.0)
Platelets: 179 10*3/uL (ref 150.0–400.0)
RBC: 4.2 Mil/uL (ref 3.87–5.11)
RDW: 12.9 % (ref 11.5–15.5)
WBC: 4.1 10*3/uL (ref 4.0–10.5)

## 2013-12-28 LAB — COMPREHENSIVE METABOLIC PANEL
ALT: 19 U/L (ref 0–35)
AST: 22 U/L (ref 0–37)
Albumin: 4.1 g/dL (ref 3.5–5.2)
Alkaline Phosphatase: 77 U/L (ref 39–117)
BUN: 10 mg/dL (ref 6–23)
CALCIUM: 9.1 mg/dL (ref 8.4–10.5)
CHLORIDE: 104 meq/L (ref 96–112)
CO2: 29 mEq/L (ref 19–32)
CREATININE: 0.6 mg/dL (ref 0.4–1.2)
GFR: 119.77 mL/min (ref >60.00)
Glucose, Bld: 87 mg/dL (ref 70–99)
POTASSIUM: 3.9 meq/L (ref 3.5–5.1)
Sodium: 142 mEq/L (ref 135–145)
Total Bilirubin: 1.4 mg/dL — ABNORMAL HIGH (ref 0.2–1.2)
Total Protein: 7.6 g/dL (ref 6.0–8.3)

## 2013-12-28 LAB — LIPID PANEL
CHOL/HDL RATIO: 5
Cholesterol: 224 mg/dL — ABNORMAL HIGH (ref 0–200)
HDL: 45 mg/dL (ref >39.00)
LDL Cholesterol: 163 mg/dL — ABNORMAL HIGH (ref 0–99)
NONHDL: 179
Triglycerides: 81 mg/dL (ref 0.0–149.0)
VLDL: 16.2 mg/dL (ref 0.0–40.0)

## 2013-12-28 LAB — TSH: TSH: 0.92 u[IU]/mL (ref 0.35–4.50)

## 2013-12-28 LAB — VITAMIN D 25 HYDROXY (VIT D DEFICIENCY, FRACTURES): VITD: 42.16 ng/mL (ref 30.00–100.00)

## 2013-12-28 NOTE — Assessment & Plan Note (Signed)
General medical exam normal today including breast and pelvic exam. PAP pending. Will check CMP and lipids with labs today. Encouraged healthy diet, regular exercise.  Mammogram ordered. Flu vaccine through her work.

## 2013-12-28 NOTE — Progress Notes (Signed)
Pre visit review using our clinic review tool, if applicable. No additional management support is needed unless otherwise documented below in the visit note. 

## 2013-12-28 NOTE — Patient Instructions (Signed)

## 2013-12-28 NOTE — Progress Notes (Signed)
Subjective:    Patient ID: Margaretha Glassing, female    DOB: [birth year], 48 y.o.   MRN: [id number]  HPI 48YO female presents for physical exam. Generally feeling well. Continues to work with dermatology regarding chronic itching. Recently seen a [facility] by liver specialist. Recommended to start statin, but has held off for now. Not following any specific diet or exercise program at present.  Review of Systems  Constitutional: Negative for fever, chills, appetite change, fatigue and unexpected weight change.  Eyes: Negative for visual disturbance.  Respiratory: Negative for shortness of breath.   Cardiovascular: Negative for chest pain and leg swelling.  Gastrointestinal: Negative for nausea, vomiting, abdominal pain, diarrhea and constipation.  Skin: Negative for color change and rash.  Hematological: Negative for adenopathy. Does not bruise/bleed easily.  Psychiatric/Behavioral: Negative for sleep disturbance and dysphoric mood. The patient is nervous/anxious.        Objective:    BP 120/80  Pulse 63  Temp(Src) 97.9 F (36.6 C) (Oral)  Ht 5' 6"  (1.676 m)  Wt 151 lb (68.493 kg)  BMI 24.38 kg/m2  SpO2 99% Physical Exam  Constitutional: She is oriented to person, place, and time. She appears well-developed and well-nourished. No distress.  HENT:  Head: Normocephalic and atraumatic.  Right Ear: External ear normal.  Left Ear: External ear normal.  Nose: Nose normal.  Mouth/Throat: Oropharynx is clear and moist. No oropharyngeal exudate.  Eyes: Conjunctivae are normal. Pupils are equal, round, and reactive to light. Right eye exhibits no discharge. Left eye exhibits no discharge. No scleral icterus.  Neck: Normal range of motion. Neck supple. No tracheal deviation present. No thyromegaly present.  Cardiovascular: Normal rate, regular rhythm, normal heart sounds and intact distal pulses.  Exam reveals no gallop and no friction rub.   No murmur heard. Pulmonary/Chest: Effort  normal and breath sounds normal. No accessory muscle usage. Not tachypneic. No respiratory distress. She has no decreased breath sounds. She has no wheezes. She has no rhonchi. She has no rales. She exhibits no tenderness.  Abdominal: Soft. Bowel sounds are normal. She exhibits no distension and no mass. There is no tenderness. There is no rebound and no guarding.  Genitourinary: Rectum normal and uterus normal. No breast swelling, tenderness, discharge or bleeding. Pelvic exam was performed with patient supine. There is no rash, tenderness or lesion on the right labia. There is no rash, tenderness or lesion on the left labia. Uterus is not enlarged and not tender. Cervix exhibits friability. Cervix exhibits no motion tenderness and no discharge. Right adnexum displays no mass, no tenderness and no fullness. Left adnexum displays no mass, no tenderness and no fullness. There is erythema around the vagina. No tenderness around the vagina. No vaginal discharge found.  Musculoskeletal: Normal range of motion. She exhibits no edema and no tenderness.  Lymphadenopathy:    She has no cervical adenopathy.  Neurological: She is alert and oriented to person, place, and time. No cranial nerve deficit. She exhibits normal muscle tone. Coordination normal.  Skin: Skin is warm and dry. No rash noted. She is not diaphoretic. No erythema. No pallor.  Psychiatric: She has a normal mood and affect. Her behavior is normal. Judgment and thought content normal.          Assessment & Plan:   Problem List Items Addressed This Visit     Unprioritized   Routine general medical examination at a health care facility - Primary     General medical exam normal today  including breast and pelvic exam. PAP pending. Will check CMP and lipids with labs today. Encouraged healthy diet, regular exercise.  Mammogram ordered. Flu vaccine through her work.      Relevant Orders      MM Digital Screening      CBC with Differential       Lipid panel      Comprehensive metabolic panel      Vit D  25 hydroxy (rtn osteoporosis monitoring)      TSH       Return in about 6 months (around 06/28/2014) for Recheck.

## 2013-12-28 NOTE — Addendum Note (Signed)
Addended by: Karlene Einstein D on: 12/28/2013 01:49 PM   Modules accepted: Orders

## 2013-12-29 LAB — CYTOLOGY - PAP

## 2014-01-13 ENCOUNTER — Encounter: Payer: Self-pay | Admitting: Internal Medicine

## 2014-01-27 ENCOUNTER — Ambulatory Visit: Payer: Self-pay | Admitting: Internal Medicine

## 2014-01-27 LAB — HM MAMMOGRAPHY: HM Mammogram: NEGATIVE

## 2014-01-28 ENCOUNTER — Encounter: Payer: Self-pay | Admitting: *Deleted

## 2014-02-09 ENCOUNTER — Encounter: Payer: Self-pay | Admitting: Internal Medicine

## 2014-02-17 ENCOUNTER — Ambulatory Visit (INDEPENDENT_AMBULATORY_CARE_PROVIDER_SITE_OTHER): Payer: BC Managed Care – PPO | Admitting: Internal Medicine

## 2014-02-17 ENCOUNTER — Encounter: Payer: Self-pay | Admitting: Internal Medicine

## 2014-02-17 VITALS — BP 130/80 | HR 78 | Temp 98.7°F | Ht 66.0 in | Wt 153.5 lb

## 2014-02-17 DIAGNOSIS — H6690 Otitis media, unspecified, unspecified ear: Secondary | ICD-10-CM | POA: Insufficient documentation

## 2014-02-17 DIAGNOSIS — H6693 Otitis media, unspecified, bilateral: Secondary | ICD-10-CM

## 2014-02-17 MED ORDER — AMOXICILLIN-POT CLAVULANATE 875-125 MG PO TABS: 1.0000 | ORAL_TABLET | Freq: Two times a day (BID) | ORAL | Status: DC

## 2014-02-17 NOTE — Patient Instructions (Signed)
Start Augmentin twice daily for ear infection.  Use Ibuprofen as needed for pain.  Follow up in 2-3 weeks.

## 2014-02-17 NOTE — Assessment & Plan Note (Signed)
Exam is most c/w OM. Will start Augmentin. Ibuprofen prn pain. If no improvement over next few weeks, we discussed referral to ENT for further evaluation.

## 2014-02-17 NOTE — Progress Notes (Signed)
   Subjective:    Patient ID: Jessica Spencer, female    DOB: 20-Feb-1966, 48 y.o.   MRN: [id number]  HPI 48YO female presents for acute visit.  Neck pain - right sided neck pain for several months. Sometimes worsened with positional change. Also has ringing in her ears. Ears ache at times. Right ear worse than left.Has trouble concentrating a work because of ear ringing. No new medications. No new supplements. No fever, chills, congestion, sore throat.   Review of Systems  Constitutional: Negative for fever, chills and unexpected weight change.  HENT: Positive for ear pain and tinnitus. Negative for congestion, ear discharge, facial swelling, hearing loss, mouth sores, nosebleeds, postnasal drip, rhinorrhea, sinus pressure, sneezing, sore throat, trouble swallowing and voice change.   Eyes: Negative for pain, discharge, redness and visual disturbance.  Respiratory: Negative for cough, chest tightness, shortness of breath, wheezing and stridor.   Cardiovascular: Negative for chest pain, palpitations and leg swelling.  Musculoskeletal: Positive for neck pain. Negative for myalgias, arthralgias and neck stiffness.  Skin: Negative for color change and rash.  Neurological: Negative for dizziness, weakness, light-headedness and headaches.  Hematological: Negative for adenopathy.       Objective:    BP 130/80 mmHg  Pulse 78  Temp(Src) 98.7 F (37.1 C) (Oral)  Ht 5' 6"  (1.676 m)  Wt 153 lb 8 oz (69.627 kg)  BMI 24.79 kg/m2  SpO2 98% Physical Exam  Constitutional: She is oriented to person, place, and time. She appears well-developed and well-nourished. No distress.  HENT:  Head: Normocephalic and atraumatic.  Right Ear: External ear normal. Tympanic membrane is erythematous and bulging. A middle ear effusion is present.  Left Ear: External ear normal. Tympanic membrane is erythematous and bulging. A middle ear effusion is present.  Nose: Nose normal.  Mouth/Throat: Oropharynx is  clear and moist.  Eyes: Conjunctivae are normal. Pupils are equal, round, and reactive to light. Right eye exhibits no discharge. Left eye exhibits no discharge. No scleral icterus.  Neck: Normal range of motion. Neck supple. No muscular tenderness present. No tracheal deviation, no edema, no erythema and normal range of motion present. No thyromegaly present.    Pulmonary/Chest: Effort normal. No accessory muscle usage. No tachypnea. She has no decreased breath sounds. She has no rhonchi.  Musculoskeletal: Normal range of motion. She exhibits no edema or tenderness.  Lymphadenopathy:    She has no cervical adenopathy.  Neurological: She is alert and oriented to person, place, and time. No cranial nerve deficit. She exhibits normal muscle tone. Coordination normal.  Skin: Skin is warm and dry. No rash noted. She is not diaphoretic. No erythema. No pallor.  Psychiatric: She has a normal mood and affect. Her behavior is normal. Judgment and thought content normal.          Assessment & Plan:   Problem List Items Addressed This Visit      Unprioritized   Otitis media - Primary    Exam is most c/w OM. Will start Augmentin. Ibuprofen prn pain. If no improvement over next few weeks, we discussed referral to ENT for further evaluation.    Relevant Medications      amoxicillin-clavulanate (AUGMENTIN) 875-125 MG per tablet       Return in about 2 weeks (around 03/03/2014).

## 2014-02-19 ENCOUNTER — Encounter: Payer: Self-pay | Admitting: Internal Medicine

## 2014-02-19 NOTE — Telephone Encounter (Signed)
Patient's ears still ringing. Please advise.

## 2014-02-24 ENCOUNTER — Other Ambulatory Visit: Payer: Self-pay | Admitting: Internal Medicine

## 2014-02-24 DIAGNOSIS — H9313 Tinnitus, bilateral: Secondary | ICD-10-CM

## 2014-02-24 NOTE — Telephone Encounter (Signed)
Patient's ears still ringing. Dr. Gilford Rile suggested ENT referral. Please advise patient.

## 2014-03-15 ENCOUNTER — Ambulatory Visit: Payer: BC Managed Care – PPO | Admitting: Internal Medicine

## 2014-03-16 HISTORY — PX: THYROGLOSSAL DUCT CYST: SHX297

## 2014-03-17 ENCOUNTER — Ambulatory Visit: Payer: Self-pay | Admitting: Otolaryngology

## 2014-04-15 ENCOUNTER — Ambulatory Visit: Payer: Self-pay | Admitting: Otolaryngology

## 2014-06-29 ENCOUNTER — Ambulatory Visit (INDEPENDENT_AMBULATORY_CARE_PROVIDER_SITE_OTHER): Payer: BLUE CROSS/BLUE SHIELD | Admitting: Internal Medicine

## 2014-06-29 ENCOUNTER — Encounter: Payer: Self-pay | Admitting: Internal Medicine

## 2014-06-29 VITALS — BP 120/76 | HR 62 | Temp 98.2°F | Ht 66.0 in | Wt 160.2 lb

## 2014-06-29 DIAGNOSIS — M549 Dorsalgia, unspecified: Secondary | ICD-10-CM | POA: Insufficient documentation

## 2014-06-29 DIAGNOSIS — K7581 Nonalcoholic steatohepatitis (NASH): Secondary | ICD-10-CM

## 2014-06-29 DIAGNOSIS — M545 Low back pain: Secondary | ICD-10-CM

## 2014-06-29 LAB — COMPREHENSIVE METABOLIC PANEL
ALT: 14 U/L (ref 0–35)
AST: 17 U/L (ref 0–37)
Albumin: 4.4 g/dL (ref 3.5–5.2)
Alkaline Phosphatase: 83 U/L (ref 39–117)
BUN: 14 mg/dL (ref 6–23)
CALCIUM: 9.5 mg/dL (ref 8.4–10.5)
CO2: 32 mEq/L (ref 19–32)
CREATININE: 0.65 mg/dL (ref 0.40–1.20)
Chloride: 104 mEq/L (ref 96–112)
GFR: 102.71 mL/min (ref >60.00)
Glucose, Bld: 94 mg/dL (ref 70–99)
Potassium: 3.8 mEq/L (ref 3.5–5.1)
Sodium: 140 mEq/L (ref 135–145)
Total Bilirubin: 1.6 mg/dL — ABNORMAL HIGH (ref 0.2–1.2)
Total Protein: 7.2 g/dL (ref 6.0–8.3)

## 2014-06-29 LAB — LIPID PANEL
CHOLESTEROL: 186 mg/dL (ref 0–200)
HDL: 44.5 mg/dL (ref >39.00)
LDL Cholesterol: 120 mg/dL — ABNORMAL HIGH (ref 0–99)
NonHDL: 141.5
TRIGLYCERIDES: 110 mg/dL (ref 0.0–149.0)
Total CHOL/HDL Ratio: 4
VLDL: 22 mg/dL (ref 0.0–40.0)

## 2014-06-29 NOTE — Patient Instructions (Signed)
Labs today.  Follow up in 6 months.

## 2014-06-29 NOTE — Progress Notes (Signed)
Subjective:    Patient ID: Jessica Spencer, female    DOB: [birth year], 49 y.o.   MRN: [id number]  HPI  49YO female presents for follow up.  Last seen 02/2014 with bilateral OM. Last seen by GI in 03/2014. Plan for MRI liver in 07/2014.  Started Pravastatin. Would like to check cholesterol. No side effects from medication.  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  Having some twitching occasionally over her lateral lower right lip. 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  Mild aching back pain over last few months. Worsened by inactivity. Improved with exercise and heating pad. Not taking any medication for this.  Wt Readings from Last 3 Encounters:  06/29/14 160 lb 4 oz (72.689 kg)  02/17/14 153 lb 8 oz (69.627 kg)  12/28/13 151 lb (68.493 kg)    Past medical, surgical, family and social history per today's encounter.  Review of Systems  Constitutional: Negative for fever, chills, appetite change, fatigue and unexpected weight change.  Eyes: Negative for visual disturbance.  Respiratory: Negative for shortness of breath.   Cardiovascular: Negative for chest pain and leg swelling.  Gastrointestinal: Negative for nausea, vomiting, abdominal pain, diarrhea and constipation.  Musculoskeletal: Positive for myalgias and back pain. Negative for arthralgias.  Skin: Negative for color change and rash.  Hematological: Negative for adenopathy. Does not bruise/bleed easily.  Psychiatric/Behavioral: Negative for suicidal ideas, sleep disturbance and dysphoric mood. The patient is not nervous/anxious.        Objective:    BP 120/76 mmHg  Pulse 62  Temp(Src) 98.2 F (36.8 C) (Oral)  Ht 5' 6"  (1.676 m)  Wt 160 lb 4 oz (72.689 kg)  BMI 25.88 kg/m2  SpO2 100% Physical Exam  Constitutional: She is oriented to person, place, and time. She appears well-developed and well-nourished. No distress.  HENT:  Head: Normocephalic and atraumatic.  Right Ear:  External ear normal.  Left Ear: External ear normal.  Nose: Nose normal.  Mouth/Throat: Oropharynx is clear and moist. No oropharyngeal exudate.  Eyes: Conjunctivae are normal. Pupils are equal, round, and reactive to light. Right eye exhibits no discharge. Left eye exhibits no discharge. No scleral icterus.  Neck: Normal range of motion. Neck supple. No tracheal deviation present. No thyromegaly present.  Cardiovascular: Normal rate, regular rhythm, normal heart sounds and intact distal pulses.  Exam reveals no gallop and no friction rub.   No murmur heard. Pulmonary/Chest: Effort normal and breath sounds normal. No respiratory distress. She has no wheezes. She has no rales. She exhibits no tenderness.  Abdominal: Soft. Bowel sounds are normal. She exhibits no distension and no mass. There is no tenderness. There is no rebound and no guarding.  Musculoskeletal: Normal range of motion. She exhibits no edema or tenderness.  Lymphadenopathy:    She has no cervical adenopathy.  Neurological: She is alert and oriented to person, place, and time. No cranial nerve deficit. She exhibits normal muscle tone. Coordination normal.  Skin: Skin is warm and dry. No rash noted. She is not diaphoretic. No erythema. No pallor.  Psychiatric: She has a normal mood and affect. Her behavior is normal. Judgment and thought content normal.          Assessment & Plan:   Problem List Items Addressed This Visit      High   NASH (nonalcoholic steatohepatitis) - Primary    Reviewed notes from [facility]. Plan for MRI next month. Will recheck LFTs and lipids today.  Relevant Orders   Comprehensive metabolic panel   Lipid panel     Unprioritized   Back pain    Diffuse aching pain lower back. Consistent with musculoskeletal strain. Encouraged rest, icing and gentle stretching of lower back. Discussed referral to PT, however she declines for now.          Return in about 6 months (around 12/30/2014) for  Physical.

## 2014-06-29 NOTE — Assessment & Plan Note (Signed)
Diffuse aching pain lower back. Consistent with musculoskeletal strain. Encouraged rest, icing and gentle stretching of lower back. Discussed referral to PT, however she declines for now.

## 2014-06-29 NOTE — Assessment & Plan Note (Signed)
Reviewed notes from [facility]. Plan for MRI next month. Will recheck LFTs and lipids today.

## 2014-06-29 NOTE — Progress Notes (Signed)
Pre visit review using our clinic review tool, if applicable. No additional management support is needed unless otherwise documented below in the visit note. 

## 2014-08-09 LAB — SURGICAL PATHOLOGY

## 2014-08-11 NOTE — Op Note (Signed)
PATIENT NAME:  Jessica Spencer, Jessica Spencer MR#:  [id number] DATE OF BIRTH:  October 31, 1965  DATE OF PROCEDURE:  04/15/2014  PREOPERATIVE DIAGNOSIS: Thyroglossal duct cyst.   POSTOPERATIVE DIAGNOSIS: Thyroglossal duct cyst.   PROCEDURE PERFORMED: Excision of thyroglossal duct cyst.  SURGEON: Jerene Bears, MD  ASSISTANT: Roena Malady, MD    ANESTHESIA: General endotracheal anesthesia.   ESTIMATED BLOOD LOSS: Less than 10 mL.   INTRAVENOUS FLUIDS: Please see anesthesia record.   COMPLICATIONS: None.  DRAINS OR STENT PLACEMENTS: surgicel  SPECIMENS: Thyroglossal duct cyst and hyoid bone.    INDICATIONS FOR PROCEDURE: The patient is a 49 year old female with history of a midline neck mass with CT scan, consistent with a thyroglossal duct cyst, superiorly displacing the hyoid bone.   OPERATIVE FINDINGS: Large thyroglossal duct cyst removed in its entirety with the midportion of the hyoid bone with cyst tract and taken all the way to the base of tongue.   DESCRIPTION OF PROCEDURE: The patient was identified in holding, benefits and risks of the procedure were discussed and consent was reviewed. The patient was taken to the operating room and placed in the supine position. General endotracheal anesthesia was induced. The patient's neck was prepped and draped in a sterile fashion after injection of 5 mL of 1:100,000 lidocaine with epinephrine, and a 15 blade scalpel was used to make a horizontal incision along the previously marked skin crease overlying the midline neck mass. Dissection with a combination of hemostats and bipolar and Bovie electrocautery was used through the platysma down to the level of the strap muscles. These were divided medially, and the sternohyoid muscles were separated from the thyroglossal duct cyst, and then using a combination of blunt and bipolar electrodissection, the attachments of the cyst were circumferentially dissected on the lateral and anterior surface, and  dissection was carried superiorly to the hyoid bone and posterior to the cyst with blunt techniques between the larynx and the back wall of the cyst. Extensive meticulous dissection was taken in this area to prevent any entering of the patient's larynx or preepiglottic space. At this time, an Allis clamp was attached to the anterior portion of the hyoid bone. This was retracted inferiorly and anteriorly, and bipolar microdissection was made superior to the hyoid bone and separating the muscles and inferior lateral to where the cyst was connected to the hyoid bone. Once the lateral portions of the hyoid bone were cleaned, the bone nippers were used to cut through the midportion of the hyoid bone en bloc with the thyroglossal duct cyst. After this was completed, a combination of a Bovie electrodissection directly on the bone, as well as blunt techniques portion of the cyst near preepiglottic space were made to prevent entering of the preepiglottic space. This time,  the tract of the cyst was taken all the way back to the base of tongue and a clamp was  placed over the connection, and this was suture ligated with silk suture. At this time, the remaining attachments were removed of the specimen. This was sent off for permanent pathological evaluation. The wound was inspected and meticulous hemostasis was ensured. The wound was copiously irrigated with sterile saline and then the sternohyoid muscles were reattached with a figure-of-eight suture after Surgicel was placed in the wound bed, and the decision was made not to place a drain, given  no evidence of any bleeding at all and very dry area. And then, the subdermal subcutaneous tissues were placed with Vicryls, and then the skin  was closed with Dermabond skin adhesive and topped with a Steri-Strip. Care of the patient at this time was transferred to anesthesia, where the patient tolerated the procedure well    ____________________________ Jerene Bears,  MD ccv:mw D: 04/15/2014 08:42:40 ET T: 04/15/2014 12:35:03 ET JOB#: [id number]  cc: Jerene Bears, MD, <Dictator> Jerene Bears MD ELECTRONICALLY SIGNED 04/28/2014 13:24

## 2014-09-07 ENCOUNTER — Other Ambulatory Visit: Payer: Self-pay | Admitting: Oncology

## 2014-09-07 DIAGNOSIS — G35 Multiple sclerosis: Secondary | ICD-10-CM

## 2014-11-19 ENCOUNTER — Other Ambulatory Visit: Payer: Self-pay | Admitting: Otolaryngology

## 2014-11-19 DIAGNOSIS — F959 Tic disorder, unspecified: Secondary | ICD-10-CM

## 2014-11-19 DIAGNOSIS — R200 Anesthesia of skin: Secondary | ICD-10-CM

## 2014-11-19 DIAGNOSIS — H9313 Tinnitus, bilateral: Secondary | ICD-10-CM

## 2014-11-24 ENCOUNTER — Telehealth: Payer: Self-pay | Admitting: Internal Medicine

## 2014-11-24 ENCOUNTER — Ambulatory Visit (INDEPENDENT_AMBULATORY_CARE_PROVIDER_SITE_OTHER): Payer: BLUE CROSS/BLUE SHIELD | Admitting: Internal Medicine

## 2014-11-24 ENCOUNTER — Encounter: Payer: Self-pay | Admitting: Internal Medicine

## 2014-11-24 VITALS — BP 126/84 | HR 86 | Temp 98.1°F | Wt 152.0 lb

## 2014-11-24 DIAGNOSIS — F411 Generalized anxiety disorder: Secondary | ICD-10-CM | POA: Diagnosis not present

## 2014-11-24 DIAGNOSIS — E876 Hypokalemia: Secondary | ICD-10-CM

## 2014-11-24 LAB — BASIC METABOLIC PANEL
BUN: 8 mg/dL (ref 6–23)
CALCIUM: 9.8 mg/dL (ref 8.4–10.5)
CO2: 31 mEq/L (ref 19–32)
CREATININE: 0.57 mg/dL (ref 0.40–1.20)
Chloride: 101 mEq/L (ref 96–112)
GFR: 119.33 mL/min (ref >60.00)
Glucose, Bld: 80 mg/dL (ref 70–99)
Potassium: 3.5 mEq/L (ref 3.5–5.1)
Sodium: 143 mEq/L (ref 135–145)

## 2014-11-24 MED ORDER — ALPRAZOLAM 0.25 MG PO TABS: 0.2500 mg | ORAL_TABLET | Freq: Three times a day (TID) | ORAL | Status: DC | PRN

## 2014-11-24 MED ORDER — FLUOXETINE HCL 20 MG PO CAPS: 20.0000 mg | ORAL_CAPSULE | Freq: Every day | ORAL | Status: DC

## 2014-11-24 NOTE — Progress Notes (Signed)
Pre visit review using our clinic review tool, if applicable. No additional management support is needed unless otherwise documented below in the visit note. 

## 2014-11-24 NOTE — Progress Notes (Signed)
   Subjective:    Patient ID: Jessica Spencer, female    DOB: 1965/11/28, 49 y.o.   MRN: [id number]  HPI  49YO female presents for acute visit.  Feels "swimmy headed" with occasional headache and tingling in hands. Notes increased stress. 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 Worsened with stress. Seen by ENT. Hearing testing was stable.  ENT scheduled an MRI for further evaluation. Extremely worried about this test. Crying frequently. Notes rapid heart rate.   Past medical, surgical, family and social history per today's encounter.   Review of Systems  Constitutional: Negative for fever, chills, appetite change, fatigue and unexpected weight change.  HENT: Positive for tinnitus. Negative for ear discharge and ear pain.   Eyes: Negative for visual disturbance.  Respiratory: Negative for shortness of breath.   Cardiovascular: Negative for chest pain and leg swelling.  Gastrointestinal: Negative for abdominal pain, diarrhea and constipation.  Skin: Negative for color change and rash.  Neurological: Positive for facial asymmetry (chronic after Bell's Palsy), light-headedness and numbness. Negative for tremors, syncope, speech difficulty and weakness.  Hematological: Negative for adenopathy. Does not bruise/bleed easily.  Psychiatric/Behavioral: Positive for sleep disturbance and dysphoric mood. Negative for suicidal ideas. The patient is nervous/anxious.        Objective:    BP 126/84 mmHg  Pulse 86  Temp(Src) 98.1 F (36.7 C) (Oral)  Wt 152 lb (68.947 kg)  SpO2 99% Physical Exam  Constitutional: She is oriented to person, place, and time. She appears well-developed and well-nourished. No distress.  HENT:  Head: Normocephalic and atraumatic.  Right Ear: External ear normal.  Left Ear: External ear normal.  Nose: Nose normal.  Mouth/Throat: Oropharynx is clear and moist. No oropharyngeal exudate.  Eyes: Conjunctivae are normal. Pupils are  equal, round, and reactive to light. Right eye exhibits no discharge. Left eye exhibits no discharge. No scleral icterus.  Neck: Normal range of motion. Neck supple. No tracheal deviation present. No thyromegaly present.  Cardiovascular: Normal rate, regular rhythm, normal heart sounds and intact distal pulses.  Exam reveals no gallop and no friction rub.   No murmur heard. Pulmonary/Chest: Effort normal and breath sounds normal. No respiratory distress. She has no wheezes. She has no rales. She exhibits no tenderness.  Musculoskeletal: Normal range of motion. She exhibits no edema or tenderness.  Lymphadenopathy:    She has no cervical adenopathy.  Neurological: She is alert and oriented to person, place, and time. No cranial nerve deficit. She exhibits normal muscle tone. Coordination normal.  Skin: Skin is warm and dry. No rash noted. She is not diaphoretic. No erythema. No pallor.  Psychiatric: Her speech is normal and behavior is normal. Judgment and thought content normal. Her mood appears anxious.          Assessment & Plan:   Problem List Items Addressed This Visit      High   Generalized anxiety disorder - Primary    Recent exacerbation of GAD. Will start Fluoxetine. Add prn Alprazolam. Discussed potential benefits and risks of this medication. Follow up in 2 weeks and prn.        Unprioritized   Hypokalemia    Recently had mildly low K at [facility] 3.1. Will repeat BMP today.      Relevant Orders   Basic Metabolic Panel (BMET)       Return in about 2 weeks (around 12/08/2014) for Recheck.

## 2014-11-24 NOTE — Telephone Encounter (Signed)
Pt is having a issue with anxiety and would like medication to calm her down. Have a great day!

## 2014-11-24 NOTE — Assessment & Plan Note (Signed)
Recent exacerbation of GAD. Will start Fluoxetine. Add prn Alprazolam. Discussed potential benefits and risks of this medication. Follow up in 2 weeks and prn.

## 2014-11-24 NOTE — Patient Instructions (Signed)
Start Fluoxetine 72m daily.  Start Alprazolam 0.273mup to three times daily as needed for anxiety.  Follow up in 2 weeks.

## 2014-11-24 NOTE — Assessment & Plan Note (Signed)
Recently had mildly low K at [facility] 3.1. Will repeat BMP today.

## 2014-11-24 NOTE — Telephone Encounter (Signed)
Spoke with pt, scheduled today at 11:30

## 2014-11-25 ENCOUNTER — Ambulatory Visit
Admission: RE | Admit: 2014-11-25 | Discharge: 2014-11-25 | Disposition: A | Discharge status: Home / Self Care | Payer: BLUE CROSS/BLUE SHIELD | Source: Ambulatory Visit | Attending: Otolaryngology | Admitting: Otolaryngology

## 2014-11-25 DIAGNOSIS — F959 Tic disorder, unspecified: Secondary | ICD-10-CM

## 2014-11-25 DIAGNOSIS — R200 Anesthesia of skin: Secondary | ICD-10-CM

## 2014-11-25 DIAGNOSIS — H9313 Tinnitus, bilateral: Secondary | ICD-10-CM | POA: Diagnosis not present

## 2014-11-25 MED ORDER — GADOBENATE DIMEGLUMINE 529 MG/ML IV SOLN
15.0000 mL | Freq: Once | INTRAVENOUS | Status: AC | PRN
  Administered 2014-11-25: 14 mL via INTRAVENOUS

## 2014-12-09 ENCOUNTER — Ambulatory Visit (INDEPENDENT_AMBULATORY_CARE_PROVIDER_SITE_OTHER): Payer: BLUE CROSS/BLUE SHIELD | Admitting: Internal Medicine

## 2014-12-09 ENCOUNTER — Encounter: Payer: Self-pay | Admitting: Internal Medicine

## 2014-12-09 VITALS — BP 117/80 | HR 63 | Temp 98.2°F | Ht 66.0 in | Wt 154.5 lb

## 2014-12-09 DIAGNOSIS — F411 Generalized anxiety disorder: Secondary | ICD-10-CM | POA: Diagnosis not present

## 2014-12-09 NOTE — Patient Instructions (Signed)
Continue Fluoxetine and Alprazolam as needed.  Follow up by email early next week.  Follow up visit in 2 weeks.

## 2014-12-09 NOTE — Assessment & Plan Note (Signed)
Symptoms improved initially, then worse with return to work. Will continue Fluoxetine for a few more days. If no improvement, then discussed changing SSRI to Lexapro. Also discussed psychiatry referral. Follow up 2 weeks and by email Sunday.

## 2014-12-09 NOTE — Progress Notes (Signed)
Pre visit review using our clinic review tool, if applicable. No additional management support is needed unless otherwise documented below in the visit note. 

## 2014-12-09 NOTE — Progress Notes (Signed)
Subjective:    Patient ID: Jessica Spencer, female    DOB: [birth year], 49 y.o.   MRN: [id number]  HPI  49YO female presents for follow up.  Recently seen for anxiety. Started on Fluoxetine. Took Alprazolam twice prior to MRI. Started Fluoxetine. Took half Alprazolam Tuesday. Some improvement with this. Feels jittery at times. Symptoms were much improved on vacation at beach. Now worsening.   Past Medical History  Diagnosis Date  . NASH (nonalcoholic steatohepatitis)     Followed by Dr. Gerald Dexter  . Hypertension   . Hyperlipidemia    Family History  Problem Relation Age of Onset  . Cancer Father    Past Surgical History  Procedure Laterality Date  . Carpal tunnel repair      Onset date 03/2008  . 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 Pryor Ochoa, [facility]   Social History   Social History  . Marital Status: Married    Spouse Name: N/A  . Number of Children: N/A  . Years of Education: N/A   Social History Main Topics  . Smoking status: Never Smoker   . Smokeless tobacco: Never Used  . Alcohol Use: No  . Drug Use: No  . Sexual Activity: Not Asked   Other Topics Concern  . None   Social History Narrative   Lives in [place]. Diet - healthy, low fat. Exercises regularly.    Review of Systems  Constitutional: Negative for fever, chills, appetite change, fatigue and unexpected weight change.  Eyes: Negative for visual disturbance.  Respiratory: Negative for shortness of breath.   Cardiovascular: Negative for chest pain and leg swelling.  Gastrointestinal: Negative for nausea, vomiting, abdominal pain, diarrhea and constipation.  Musculoskeletal: Negative for myalgias and arthralgias.  Skin: Negative for color change and rash.  Hematological: Negative for adenopathy. Does not bruise/bleed easily.  Psychiatric/Behavioral: Negative for sleep disturbance and dysphoric mood. The patient is nervous/anxious.        Objective:    BP 117/80 mmHg  Pulse 63   Temp(Src) 98.2 F (36.8 C) (Oral)  Ht 5' 6"  (1.676 m)  Wt 154 lb 8 oz (70.081 kg)  BMI 24.95 kg/m2  SpO2 100% Physical Exam  Constitutional: She is oriented to person, place, and time. She appears well-developed and well-nourished. No distress.  HENT:  Head: Normocephalic and atraumatic.  Right Ear: External ear normal.  Left Ear: External ear normal.  Nose: Nose normal.  Mouth/Throat: Oropharynx is clear and moist.  Eyes: Conjunctivae are normal. Pupils are equal, round, and reactive to light. Right eye exhibits no discharge. Left eye exhibits no discharge. No scleral icterus.  Neck: Normal range of motion. Neck supple. No tracheal deviation present. No thyromegaly present.  Cardiovascular: Normal rate, regular rhythm, normal heart sounds and intact distal pulses.  Exam reveals no gallop and no friction rub.   No murmur heard. Pulmonary/Chest: Effort normal and breath sounds normal. No respiratory distress. She has no wheezes. She has no rales. She exhibits no tenderness.  Musculoskeletal: Normal range of motion. She exhibits no edema or tenderness.  Lymphadenopathy:    She has no cervical adenopathy.  Neurological: She is alert and oriented to person, place, and time. No cranial nerve deficit. She exhibits normal muscle tone. Coordination normal.  Skin: Skin is warm and dry. No rash noted. She is not diaphoretic. No erythema. No pallor.  Psychiatric: Her behavior is normal. Judgment and thought content normal. Her mood appears anxious.  Assessment & Plan:   Problem List Items Addressed This Visit      High   Generalized anxiety disorder - Primary    Symptoms improved initially, then worse with return to work. Will continue Fluoxetine for a few more days. If no improvement, then discussed changing SSRI to Lexapro. Also discussed psychiatry referral. Follow up 2 weeks and by email Sunday.          Return in about 2 weeks (around 12/23/2014) for Recheck.

## 2014-12-13 ENCOUNTER — Encounter: Payer: Self-pay | Admitting: Internal Medicine

## 2014-12-27 ENCOUNTER — Encounter: Payer: Self-pay | Admitting: Internal Medicine

## 2014-12-27 ENCOUNTER — Telehealth: Payer: Self-pay | Admitting: Internal Medicine

## 2014-12-27 MED ORDER — ESCITALOPRAM OXALATE 5 MG PO TABS: 5.0000 mg | ORAL_TABLET | Freq: Every day | ORAL | Status: DC

## 2014-12-27 NOTE — Telephone Encounter (Signed)
Pt appt needed to be resch fro 09/13 and pt wanted to come in before her physical appt. No avail appt. Let me know where I can sch. I sch pt on 09/23 just in case nothing was avail. Thank You!

## 2014-12-27 NOTE — Telephone Encounter (Signed)
I would leave on 9/23 as only 2 weeks away

## 2014-12-27 NOTE — Telephone Encounter (Signed)
Left mess on pt vm. Thank You

## 2014-12-27 NOTE — Telephone Encounter (Signed)
Please advise 

## 2014-12-28 ENCOUNTER — Ambulatory Visit: Payer: Self-pay | Admitting: Internal Medicine

## 2015-01-03 ENCOUNTER — Ambulatory Visit (INDEPENDENT_AMBULATORY_CARE_PROVIDER_SITE_OTHER): Payer: BLUE CROSS/BLUE SHIELD | Admitting: Internal Medicine

## 2015-01-03 ENCOUNTER — Encounter: Payer: Self-pay | Admitting: Internal Medicine

## 2015-01-03 ENCOUNTER — Encounter: Payer: Self-pay | Admitting: *Deleted

## 2015-01-03 VITALS — BP 123/78 | HR 65 | Temp 98.4°F | Ht 65.5 in | Wt 156.1 lb

## 2015-01-03 DIAGNOSIS — F411 Generalized anxiety disorder: Secondary | ICD-10-CM | POA: Diagnosis not present

## 2015-01-03 DIAGNOSIS — R251 Tremor, unspecified: Secondary | ICD-10-CM | POA: Diagnosis not present

## 2015-01-03 NOTE — Progress Notes (Signed)
Pre visit review using our clinic review tool, if applicable. No additional management support is needed unless otherwise documented below in the visit note. 

## 2015-01-03 NOTE — Assessment & Plan Note (Signed)
Symptoms persistent, however started Lexapro 34m only 1 week ago. Will recheck in 1-2 weeks. Discussed potentially increasing dosage to 165mif symptoms persistent. Continue prn Alprazolam.

## 2015-01-03 NOTE — Progress Notes (Signed)
Subjective:    Patient ID: Jessica Spencer, female    DOB: [birth year], 49 y.o.   MRN: [id number]  HPI  49YO female presents for follow up.  Started Lexapro 1 week ago. Notes some improvement in "jitteriness" and hand tremor. Continues to have anxiety. No difficulty sleeping.Concerned she may have Parkinsons.   BP Readings from Last 3 Encounters:  01/03/15 123/78  12/09/14 117/80  11/24/14 126/84   Wt Readings from Last 3 Encounters:  01/03/15 156 lb 2 oz (70.818 kg)  12/09/14 154 lb 8 oz (70.081 kg)  11/24/14 152 lb (68.947 kg)     Past Medical History  Diagnosis Date  . NASH (nonalcoholic steatohepatitis)     Followed by Dr. Gerald Dexter  . Hypertension   . Hyperlipidemia    Family History  Problem Relation Age of Onset  . Cancer Father    Past Surgical History  Procedure Laterality Date  . Carpal tunnel repair      Onset date 03/2008  . 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 Pryor Ochoa, [facility]   Social History   Social History  . Marital Status: Married    Spouse Name: N/A  . Number of Children: N/A  . Years of Education: N/A   Social History Main Topics  . Smoking status: Never Smoker   . Smokeless tobacco: Never Used  . Alcohol Use: No  . Drug Use: No  . Sexual Activity: Not Asked   Other Topics Concern  . None   Social History Narrative   Lives in [place]. Diet - healthy, low fat. Exercises regularly.    Review of Systems  Constitutional: Negative for fever, chills, appetite change, fatigue and unexpected weight change.  Eyes: Negative for visual disturbance.  Respiratory: Negative for shortness of breath.   Cardiovascular: Negative for chest pain and leg swelling.  Gastrointestinal: Negative for abdominal pain.  Skin: Negative for color change and rash.  Neurological: Positive for tremors. Negative for seizures and light-headedness.  Hematological: Negative for adenopathy. Does not bruise/bleed easily.  Psychiatric/Behavioral: Negative for  sleep disturbance and dysphoric mood. The patient is nervous/anxious.        Objective:    BP 123/78 mmHg  Pulse 65  Temp(Src) 98.4 F (36.9 C) (Oral)  Ht 5' 5.5" (1.664 m)  Wt 156 lb 2 oz (70.818 kg)  BMI 25.58 kg/m2  SpO2 99% Physical Exam  Constitutional: She is oriented to person, place, and time. She appears well-developed and well-nourished. No distress.  HENT:  Head: Normocephalic and atraumatic.  Right Ear: External ear normal.  Left Ear: External ear normal.  Nose: Nose normal.  Mouth/Throat: Oropharynx is clear and moist. No oropharyngeal exudate.  Eyes: Conjunctivae are normal. Pupils are equal, round, and reactive to light. Right eye exhibits no discharge. Left eye exhibits no discharge. No scleral icterus.  Neck: Normal range of motion. Neck supple. No tracheal deviation present. No thyromegaly present.  Cardiovascular: Normal rate, regular rhythm, normal heart sounds and intact distal pulses.  Exam reveals no gallop and no friction rub.   No murmur heard. Pulmonary/Chest: Effort normal and breath sounds normal. No respiratory distress. She has no wheezes. She has no rales. She exhibits no tenderness.  Musculoskeletal: Normal range of motion. She exhibits no edema or tenderness.  Lymphadenopathy:    She has no cervical adenopathy.  Neurological: She is alert and oriented to person, place, and time. She displays no atrophy and no tremor. No cranial nerve deficit or sensory deficit. She  exhibits normal muscle tone. Coordination and gait normal.  Skin: Skin is warm and dry. No rash noted. She is not diaphoretic. No erythema. No pallor.  Psychiatric: Her speech is normal and behavior is normal. Judgment and thought content normal. Her mood appears anxious. Cognition and memory are normal.          Assessment & Plan:  Over 22mn of which >50% spent in face-to-face contact with patient discussing plan of care  Problem List Items Addressed This Visit      High    Generalized anxiety disorder - Primary    Symptoms persistent, however started Lexapro 540monly 1 week ago. Will recheck in 1-2 weeks. Discussed potentially increasing dosage to 1076mf symptoms persistent. Continue prn Alprazolam.        Unprioritized   Tremor of both hands    Pt reports bilateral hand tremor, which was worsened with use of Fluoxetine. Some improvement with stopping this medication. No observed tremor on exam today. Neurology evaluation pending for tomorrow.          Return in about 2 weeks (around 01/17/2015) for Recheck.

## 2015-01-03 NOTE — Patient Instructions (Signed)
Continue Lexapro.  Follow up in 2 weeks.

## 2015-01-03 NOTE — Assessment & Plan Note (Signed)
Pt reports bilateral hand tremor, which was worsened with use of Fluoxetine. Some improvement with stopping this medication. No observed tremor on exam today. Neurology evaluation pending for tomorrow.

## 2015-01-07 ENCOUNTER — Encounter: Payer: Self-pay | Admitting: Internal Medicine

## 2015-01-07 ENCOUNTER — Ambulatory Visit (INDEPENDENT_AMBULATORY_CARE_PROVIDER_SITE_OTHER): Payer: BLUE CROSS/BLUE SHIELD | Admitting: Internal Medicine

## 2015-01-07 VITALS — BP 138/76 | HR 59 | Temp 97.9°F | Ht 65.5 in | Wt 156.2 lb

## 2015-01-07 DIAGNOSIS — N952 Postmenopausal atrophic vaginitis: Secondary | ICD-10-CM | POA: Diagnosis not present

## 2015-01-07 DIAGNOSIS — Z0000 Encounter for general adult medical examination without abnormal findings: Secondary | ICD-10-CM | POA: Diagnosis not present

## 2015-01-07 DIAGNOSIS — Z1211 Encounter for screening for malignant neoplasm of colon: Secondary | ICD-10-CM | POA: Diagnosis not present

## 2015-01-07 MED ORDER — ESTROGENS, CONJUGATED 0.625 MG/GM VA CREA: 1.0000 | TOPICAL_CREAM | Freq: Every day | VAGINAL | Status: DC

## 2015-01-07 NOTE — Progress Notes (Signed)
Pre visit review using our clinic review tool, if applicable. No additional management support is needed unless otherwise documented below in the visit note. 

## 2015-01-07 NOTE — Progress Notes (Signed)
Subjective:    Patient ID: Jessica Spencer, female    DOB: 09-27-1965, 49 y.o.   MRN: [id number]  HPI  49YO female presents for physical exam.  Feeling well. No concerns today. Seen by neurology and starting with exercises to improve neck pain. Notes some pain with intercourse and has h/o atrophy on PAP. Using silicone based lubricant with no improvement.  BP Readings from Last 3 Encounters:  01/07/15 138/76  01/03/15 123/78  12/09/14 117/80   Wt Readings from Last 3 Encounters:  01/07/15 156 lb 4 oz (70.875 kg)  01/03/15 156 lb 2 oz (70.818 kg)  12/09/14 154 lb 8 oz (70.081 kg)     Past Medical History  Diagnosis Date  . NASH (nonalcoholic steatohepatitis)     Followed by Dr. Gerald Dexter  . Hypertension   . Hyperlipidemia    Family History  Problem Relation Age of Onset  . Cancer Father    Past Surgical History  Procedure Laterality Date  . Carpal tunnel repair      Onset date 03/2008  . 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 Pryor Ochoa, [facility]   Social History   Social History  . Marital Status: Married    Spouse Name: N/A  . Number of Children: N/A  . Years of Education: N/A   Social History Main Topics  . Smoking status: Never Smoker   . Smokeless tobacco: Never Used  . Alcohol Use: No  . Drug Use: No  . Sexual Activity: Not Asked   Other Topics Concern  . None   Social History Narrative   Lives in [place]. Diet - healthy, low fat. Exercises regularly.    Review of Systems  Constitutional: Negative for fever, chills, appetite change, fatigue and unexpected weight change.  Eyes: Negative for visual disturbance.  Respiratory: Negative for shortness of breath.   Cardiovascular: Negative for chest pain and leg swelling.  Gastrointestinal: Negative for abdominal pain, diarrhea and constipation.  Genitourinary: Positive for dyspareunia. Negative for vaginal bleeding, vaginal discharge, vaginal pain and pelvic pain.  Musculoskeletal: Negative for  myalgias and arthralgias.  Skin: Negative for color change and rash.  Hematological: Negative for adenopathy. Does not bruise/bleed easily.  Psychiatric/Behavioral: Negative for sleep disturbance and dysphoric mood. The patient is not nervous/anxious.        Objective:    BP 138/76 mmHg  Pulse 59  Temp(Src) 97.9 F (36.6 C) (Oral)  Ht 5' 5.5" (1.664 m)  Wt 156 lb 4 oz (70.875 kg)  BMI 25.60 kg/m2  SpO2 97% Physical Exam  Constitutional: She is oriented to person, place, and time. She appears well-developed and well-nourished. No distress.  HENT:  Head: Normocephalic and atraumatic.  Right Ear: External ear normal.  Left Ear: External ear normal.  Nose: Nose normal.  Mouth/Throat: Oropharynx is clear and moist. No oropharyngeal exudate.  Eyes: Conjunctivae are normal. Pupils are equal, round, and reactive to light. Right eye exhibits no discharge. Left eye exhibits no discharge. No scleral icterus.  Neck: Normal range of motion. Neck supple. No tracheal deviation present. No thyromegaly present.  Cardiovascular: Normal rate, regular rhythm, normal heart sounds and intact distal pulses.  Exam reveals no gallop and no friction rub.   No murmur heard. Pulmonary/Chest: Effort normal and breath sounds normal. No accessory muscle usage. No tachypnea. No respiratory distress. She has no decreased breath sounds. She has no wheezes. She has no rales. She exhibits no tenderness. Right breast exhibits no inverted nipple, no mass, no  nipple discharge, no skin change and no tenderness. Left breast exhibits no inverted nipple, no mass, no nipple discharge, no skin change and no tenderness. Breasts are symmetrical.  Abdominal: Soft. Bowel sounds are normal. She exhibits no distension and no mass. There is no tenderness. There is no rebound and no guarding.  Musculoskeletal: Normal range of motion. She exhibits no edema or tenderness.  Lymphadenopathy:    She has no cervical adenopathy.    Neurological: She is alert and oriented to person, place, and time. No cranial nerve deficit. She exhibits normal muscle tone. Coordination normal.  Skin: Skin is warm and dry. No rash noted. She is not diaphoretic. No erythema. No pallor.  Psychiatric: She has a normal mood and affect. Her behavior is normal. Judgment and thought content normal.          Assessment & Plan:   Problem List Items Addressed This Visit      Unprioritized   Atrophic vaginitis    Symptoms c/w atrophic vaginitis. No improvement with silicone based lubricants. Discussed potential treatments. Will start topical Premarin daily x 1-2 weeks then 1-2 times per week. Follow up prn.      Relevant Medications   conjugated estrogens (PREMARIN) vaginal cream   Routine general medical examination at a health care facility - Primary    General medical exam normal today including breast exam. PAP and pelvic deferred as normal in 2015, HPV neg. Flu vaccine to be obtained at work. Reviewed recent labs which were normal. Encouraged healthy diet and exercise.       Other Visit Diagnoses    Screening for colon cancer        Relevant Orders    Ambulatory referral to Gastroenterology        Return in about 3 months (around 04/08/2015) for Recheck.

## 2015-01-07 NOTE — Assessment & Plan Note (Addendum)
General medical exam normal today including breast exam. PAP and pelvic deferred as normal in 2015, HPV neg. Flu vaccine to be obtained at work. Reviewed recent labs which were normal. Encouraged healthy diet and exercise.

## 2015-01-07 NOTE — Patient Instructions (Signed)

## 2015-01-07 NOTE — Assessment & Plan Note (Signed)
Symptoms c/w atrophic vaginitis. No improvement with silicone based lubricants. Discussed potential treatments. Will start topical Premarin daily x 1-2 weeks then 1-2 times per week. Follow up prn.

## 2015-01-13 ENCOUNTER — Encounter: Payer: Self-pay | Admitting: Internal Medicine

## 2015-01-17 ENCOUNTER — Ambulatory Visit: Payer: Self-pay | Admitting: Internal Medicine

## 2015-01-18 ENCOUNTER — Encounter: Payer: Self-pay | Admitting: Internal Medicine

## 2015-02-04 ENCOUNTER — Ambulatory Visit
Admission: RE | Admit: 2015-02-04 | Discharge: 2015-02-04 | Disposition: A | Discharge status: Home / Self Care | Payer: BLUE CROSS/BLUE SHIELD | Source: Ambulatory Visit | Attending: Internal Medicine | Admitting: Internal Medicine

## 2015-02-04 DIAGNOSIS — Z1231 Encounter for screening mammogram for malignant neoplasm of breast: Secondary | ICD-10-CM | POA: Diagnosis not present

## 2015-02-04 DIAGNOSIS — Z0000 Encounter for general adult medical examination without abnormal findings: Secondary | ICD-10-CM

## 2015-02-07 ENCOUNTER — Encounter: Payer: Self-pay | Admitting: Internal Medicine

## 2015-02-07 ENCOUNTER — Telehealth: Payer: Self-pay | Admitting: Internal Medicine

## 2015-02-07 NOTE — Telephone Encounter (Signed)
Yes i am in the process of taking care of this one. Thank you

## 2015-02-07 NOTE — Telephone Encounter (Signed)
thanks

## 2015-02-07 NOTE — Telephone Encounter (Signed)
Jessica Spencer, did you talk to her and schedule that appointment for the 7th?  If not I will call her back.

## 2015-02-07 NOTE — Telephone Encounter (Signed)
Pt called to get her mammo results from last Friday 02/04/2015. Pt states she received something but not sure what it is. Pt would really like to speak to Dr Gilford Rile. Thank You!

## 2015-02-18 ENCOUNTER — Encounter: Payer: Self-pay | Admitting: Internal Medicine

## 2015-02-25 ENCOUNTER — Encounter: Payer: Self-pay | Admitting: Internal Medicine

## 2015-02-25 ENCOUNTER — Ambulatory Visit (AMBULATORY_SURGERY_CENTER): Payer: Self-pay | Admitting: *Deleted

## 2015-02-25 VITALS — Ht 65.0 in | Wt 155.0 lb

## 2015-02-25 DIAGNOSIS — Z1211 Encounter for screening for malignant neoplasm of colon: Secondary | ICD-10-CM

## 2015-02-25 NOTE — Progress Notes (Signed)
No egg or soy allergy No issues with past sedation No diet pills No home 02 use   emmi declined Pt states she is constipated more than not. She states she uses miralax as needed. Will do 2 day colon prep after discussing with pt. She agrees this is needed

## 2015-03-09 ENCOUNTER — Ambulatory Visit: Payer: Self-pay | Admitting: Family Medicine

## 2015-03-09 DIAGNOSIS — Z0289 Encounter for other administrative examinations: Secondary | ICD-10-CM

## 2015-03-24 ENCOUNTER — Encounter: Payer: Self-pay | Admitting: Internal Medicine

## 2015-03-24 ENCOUNTER — Ambulatory Visit (AMBULATORY_SURGERY_CENTER): Payer: BLUE CROSS/BLUE SHIELD | Admitting: Internal Medicine

## 2015-03-24 VITALS — BP 115/77 | HR 65 | Temp 98.2°F | Resp 43 | Ht 65.0 in | Wt 155.0 lb

## 2015-03-24 DIAGNOSIS — Z1211 Encounter for screening for malignant neoplasm of colon: Secondary | ICD-10-CM

## 2015-03-24 LAB — HM COLONOSCOPY: HM COLON: NORMAL

## 2015-03-24 MED ORDER — SODIUM CHLORIDE 0.9 % IV SOLN: 500.0000 mL | INTRAVENOUS | Status: DC

## 2015-03-24 NOTE — Op Note (Signed)
[place]  [address] [place], [address]   COLONOSCOPY PROCEDURE REPORT  PATIENT: Jessica Spencer, Jessica Spencer  MR#: [id number] BIRTHDATE: August 04, 1965 , 35  yrs. old GENDER: female ENDOSCOPIST: Gatha Mayer, MD, [facility] PROCEDURE DATE:  03/24/2015 PROCEDURE:   Colonoscopy, screening First Screening Colonoscopy - Avg.  risk and is 50 yrs.  old or older Yes.  Prior Negative Screening - Now for repeat screening. N/A  History of Adenoma - Now for follow-up colonoscopy & has been > or = to 3 yrs.  N/A  Polyps removed today? No Recommend repeat exam, <10 yrs? No ASA CLASS:   Class II INDICATIONS:Screening for colonic neoplasia and Colorectal Neoplasm Risk Assessment for this procedure is average risk. MEDICATIONS: Propofol 200 mg IV and Monitored anesthesia care  DESCRIPTION OF PROCEDURE:   After the risks benefits and alternatives of the procedure were thoroughly explained, informed consent was obtained.  The digital rectal exam revealed no abnormalities of the rectum.   The LB [id number]  endoscope was introduced through the anus and advanced to the cecum, which was identified by both the appendix and ileocecal valve. No adverse events experienced.   The quality of the prep was excellent. (MiraLax was used)  The instrument was then slowly withdrawn as the colon was fully examined. Estimated blood loss is zero unless otherwise noted in this procedure report.      COLON FINDINGS: A normal appearing cecum, ileocecal valve, and appendiceal orifice were identified.  The ascending, transverse, descending, sigmoid colon, and rectum appeared unremarkable. Retroflexed views revealed no abnormalities. The time to cecum = 2.5 Withdrawal time = 6.6   The scope was withdrawn and the procedure completed. COMPLICATIONS: There were no immediate complications.  ENDOSCOPIC IMPRESSION: Normal colonoscopy - excellent prep  RECOMMENDATIONS: Repeat colonoscopy/screening test  10 years.  2026-7  eSigned:  Gatha Mayer, MD, [facility] 03/24/2015 8:20 AM   cc: Dr. Ronette Deter and The Patient

## 2015-03-24 NOTE — Patient Instructions (Addendum)
I appreciate the opportunity to care for you. Gatha Mayer, MD, FACG    YOU HAD AN ENDOSCOPIC PROCEDURE TODAY AT [facility]:   Refer to the procedure report that was given to you for any specific questions about what was found during the examination.  If the procedure report does not answer your questions, please call your gastroenterologist to clarify.  If you requested that your care partner not be given the details of your procedure findings, then the procedure report has been included in a sealed envelope for you to review at your convenience later.  YOU SHOULD EXPECT: Some feelings of bloating in the abdomen. Passage of more gas than usual.  Walking can help get rid of the air that was put into your GI tract during the procedure and reduce the bloating. If you had a lower endoscopy (such as a colonoscopy or flexible sigmoidoscopy) you may notice spotting of blood in your stool or on the toilet paper. If you underwent a bowel prep for your procedure, you may not have a normal bowel movement for a few days.  Please Note:  You might notice some irritation and congestion in your nose or some drainage.  This is from the oxygen used during your procedure.  There is no need for concern and it should clear up in a day or so.  SYMPTOMS TO REPORT IMMEDIATELY:   Following lower endoscopy (colonoscopy or flexible sigmoidoscopy):  Excessive amounts of blood in the stool  Significant tenderness or worsening of abdominal pains  Swelling of the abdomen that is new, acute  Fever of 100F or higher   For urgent or emergent issues, a gastroenterologist can be reached at any hour by calling [phone].   DIET: Your first meal following the procedure should be a small meal and then it is ok to progress to your normal diet. Heavy or fried foods are harder to digest and may make you feel nauseous or bloated.  Likewise, meals heavy in dairy and vegetables can increase bloating.   Drink plenty of fluids but you should avoid alcoholic beverages for 24 hours.  ACTIVITY:  You should plan to take it easy for the rest of today and you should NOT DRIVE or use heavy machinery until tomorrow (because of the sedation medicines used during the test).    FOLLOW UP: Our staff will call the number listed on your records the next business day following your procedure to check on you and address any questions or concerns that you may have regarding the information given to you following your procedure. If we do not reach you, we will leave a message.  However, if you are feeling well and you are not experiencing any problems, there is no need to return our call.  We will assume that you have returned to your regular daily activities without incident.  If any biopsies were taken you will be contacted by phone or by letter within the next 1-3 weeks.  Please call us at [phone] if you have not heard about the biopsies in 3 weeks.    SIGNATURES/CONFIDENTIALITY: You and/or your care partner have signed paperwork which will be entered into your electronic medical record.  These signatures attest to the fact that that the information above on your After Visit Summary has been reviewed and is understood.  Full responsibility of the confidentiality of this discharge information lies with you and/or your care-partner.     You may resume your  current medications today. Please call if any questions or concerns.

## 2015-03-24 NOTE — Progress Notes (Signed)
No problems noted in the recovery room. maw 

## 2015-03-24 NOTE — Progress Notes (Signed)
Report to PACU, RN, vss, BBS= Clear.  

## 2015-03-25 ENCOUNTER — Telehealth: Payer: Self-pay | Admitting: *Deleted

## 2015-03-25 NOTE — Telephone Encounter (Signed)
  Follow up Call-  Call back number 03/24/2015  Post procedure Call Back phone  # [phone]  Permission to leave phone message Yes    [phone] Patient questions:  Do you have a fever, pain , or abdominal swelling? No. Pain Score  0 *  Have you tolerated food without any problems? Yes.    Have you been able to return to your normal activities? Yes.    Do you have any questions about your discharge instructions: Diet   No. Medications  No. Follow up visit  No.  Do you have questions or concerns about your Care? No.  Actions: * If pain score is 4 or above: No action needed, pain <4.

## 2015-04-08 ENCOUNTER — Ambulatory Visit: Payer: Self-pay | Admitting: Internal Medicine

## 2015-04-08 ENCOUNTER — Ambulatory Visit (INDEPENDENT_AMBULATORY_CARE_PROVIDER_SITE_OTHER): Payer: BLUE CROSS/BLUE SHIELD | Admitting: Internal Medicine

## 2015-04-08 ENCOUNTER — Encounter: Payer: Self-pay | Admitting: Internal Medicine

## 2015-04-08 VITALS — BP 113/80 | HR 71 | Temp 97.6°F | Ht 65.5 in | Wt 161.0 lb

## 2015-04-08 DIAGNOSIS — G8929 Other chronic pain: Secondary | ICD-10-CM | POA: Diagnosis not present

## 2015-04-08 DIAGNOSIS — F411 Generalized anxiety disorder: Secondary | ICD-10-CM

## 2015-04-08 DIAGNOSIS — K7581 Nonalcoholic steatohepatitis (NASH): Secondary | ICD-10-CM

## 2015-04-08 DIAGNOSIS — M542 Cervicalgia: Secondary | ICD-10-CM

## 2015-04-08 MED ORDER — ESCITALOPRAM OXALATE 5 MG PO TABS: 5.0000 mg | ORAL_TABLET | Freq: Every day | ORAL | Status: DC

## 2015-04-08 NOTE — Assessment & Plan Note (Signed)
Chronic neck pain likely related to muscular spasm and strain. Continue prn Flexeril.

## 2015-04-08 NOTE — Assessment & Plan Note (Signed)
Symptoms well controlled on current dose of Lexapro. Discussed some options for counseling locally. She will call if she would like to proceed with this.

## 2015-04-08 NOTE — Assessment & Plan Note (Signed)
Follow up at [facility] scheduled next month with labs and [place].

## 2015-04-08 NOTE — Progress Notes (Signed)
Pre-visit discussion using our clinic review tool. No additional management support is needed unless otherwise documented below in the visit note.  

## 2015-04-08 NOTE — Progress Notes (Signed)
Subjective:    Patient ID: Jessica Spencer, female    DOB: 01-Jun-1965, 49 y.o.   MRN: [id number]  HPI  49YO female presents for follow up.  Last seen 12/2014 for general exam.  Recent Colonoscopy was normal. Next 10 years.  Difficult time. Husband asked for divorce. Trying to work on things. He has declined counseling. Taking Lexapro. Notes improvement in anxiety with medication.  Continues to have some intermittent neck pain. Described as aching pain in bilateral neck. Uses occasional Flexeril with improvement. Worse during periods of stress. Continues some exercises at home.  Scheduled to follow up at [facility] in January with [place] and labs.  Wt Readings from Last 3 Encounters:  04/08/15 161 lb (73.029 kg)  03/24/15 155 lb (70.308 kg)  02/25/15 155 lb (70.308 kg)   BP Readings from Last 3 Encounters:  04/08/15 113/80  03/24/15 115/77  01/07/15 138/76    Past Medical History  Diagnosis Date  . NASH (nonalcoholic steatohepatitis)     Followed by Dr. Gerald Dexter  . Hypertension   . Hyperlipidemia   . Anxiety   . Depression   . Constipation     uses miralax PRN only    Family History  Problem Relation Age of Onset  . Cancer Father   . Breast cancer Mother 20  . Colon polyps Mother   . Diverticulosis Mother   . Colon cancer Neg Hx   . Esophageal cancer Neg Hx   . Rectal cancer Neg Hx   . Stomach cancer Neg Hx    Past Surgical History  Procedure Laterality Date  . Carpal tunnel repair      Onset date 03/2008  . 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 Pryor Ochoa, [facility]  . Abdominal hysterectomy    . Appendectomy    . Small intestine obstruction    . Carpal tunnel release Right   . Abdominal surgery      x3  . Colonoscopy  2004, 2016  . Esophagogastroduodenoscopy  2004   Social History   Social History  . Marital Status: Married    Spouse Name: N/A  . Number of Children: N/A  . Years of Education: N/A   Social History Main Topics  . Smoking status: Never  Smoker   . Smokeless tobacco: Never Used  . Alcohol Use: No  . Drug Use: No  . Sexual Activity: Not Asked   Other Topics Concern  . None   Social History Narrative   Lives in [place]. Diet - healthy, low fat. Exercises regularly.    Review of Systems  Constitutional: Negative for fever, chills, appetite change, fatigue and unexpected weight change.  Eyes: Negative for visual disturbance.  Respiratory: Negative for shortness of breath.   Cardiovascular: Negative for chest pain and leg swelling.  Gastrointestinal: Negative for nausea, vomiting, abdominal pain, diarrhea and constipation.  Musculoskeletal: Negative for myalgias and arthralgias.  Skin: Negative for color change and rash.  Hematological: Negative for adenopathy. Does not bruise/bleed easily.  Psychiatric/Behavioral: Positive for dysphoric mood. Negative for suicidal ideas and sleep disturbance. The patient is nervous/anxious.        Objective:    BP 113/80 mmHg  Pulse 71  Temp(Src) 97.6 F (36.4 C) (Oral)  Ht 5' 5.5" (1.664 m)  Wt 161 lb (73.029 kg)  BMI 26.37 kg/m2  SpO2 100% Physical Exam  Constitutional: She is oriented to person, place, and time. She appears well-developed and well-nourished. No distress.  HENT:  Head: Normocephalic and  atraumatic.  Right Ear: External ear normal.  Left Ear: External ear normal.  Nose: Nose normal.  Mouth/Throat: Oropharynx is clear and moist. No oropharyngeal exudate.  Eyes: Conjunctivae and EOM are normal. Pupils are equal, round, and reactive to light. Right eye exhibits no discharge.  Neck: Normal range of motion. Neck supple. No thyromegaly present.  Cardiovascular: Normal rate, regular rhythm, normal heart sounds and intact distal pulses.  Exam reveals no gallop and no friction rub.   No murmur heard. Pulmonary/Chest: Effort normal. No respiratory distress. She has no wheezes. She has no rales.  Abdominal: Soft. Bowel sounds are normal. She exhibits no  distension and no mass. There is no tenderness. There is no rebound and no guarding.  Musculoskeletal: Normal range of motion. She exhibits no edema or tenderness.  Lymphadenopathy:    She has no cervical adenopathy.  Neurological: She is alert and oriented to person, place, and time. No cranial nerve deficit. Coordination normal.  Skin: Skin is warm and dry. No rash noted. She is not diaphoretic. No erythema. No pallor.  Psychiatric: She has a normal mood and affect. Her behavior is normal. Judgment and thought content normal.          Assessment & Plan:   Problem List Items Addressed This Visit      High   Generalized anxiety disorder - Primary    Symptoms well controlled on current dose of Lexapro. Discussed some options for counseling locally. She will call if she would like to proceed with this.      NASH (nonalcoholic steatohepatitis)    Follow up at [facility] scheduled next month with labs and [place].        Unprioritized   Neck pain, chronic    Chronic neck pain likely related to muscular spasm and strain. Continue prn Flexeril.      Relevant Medications   escitalopram (LEXAPRO) 5 MG tablet       Return in about 3 months (around 07/07/2015) for Recheck.

## 2015-04-08 NOTE — Patient Instructions (Addendum)
Continue current medications.  Follow up in 3 months or sooner as needed.

## 2015-04-27 IMAGING — MG MM DIGITAL SCREENING BILAT W/ CAD
1 series · 4 of 4 positions shown · non-contrast
Comparison: Previous exam(s).

REASON FOR EXAM: SCR MAMMO NO ORDER
COMMENTS:

PROCEDURE:     MAM - MAM DGTL SCRN MAM NO ORDER W/CAD  - January 15, 2013  [DATE]
CLINICAL DATA: Screening.
DIGITAL SCREENING BILATERAL MAMMOGRAM WITH CAD

[R CC · right · 4 of 4 slices shown]
[im 1/4]
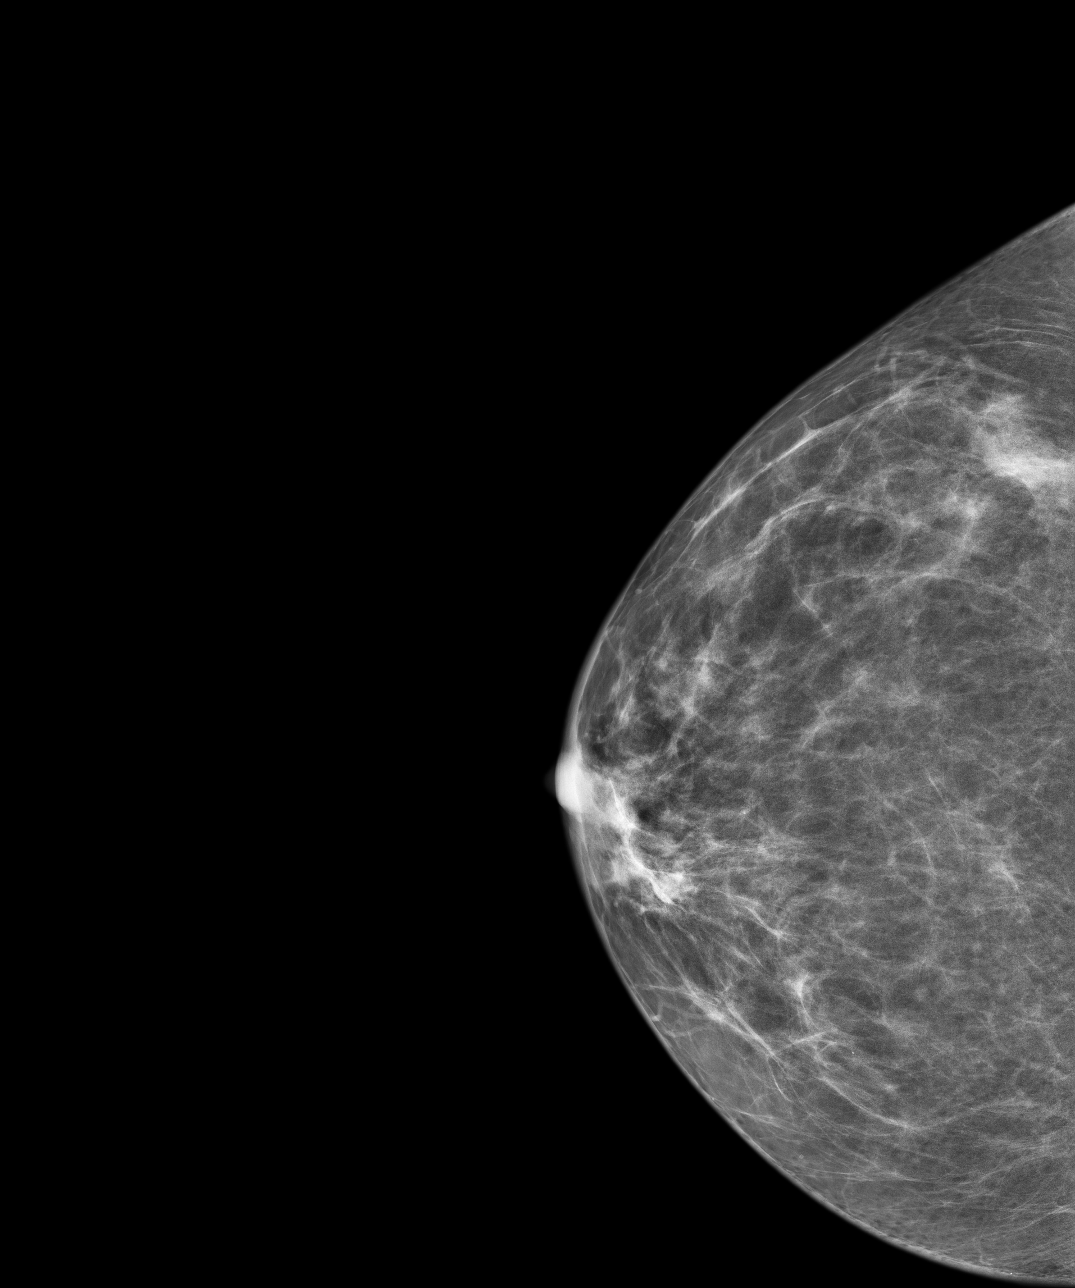
[im 2/4]
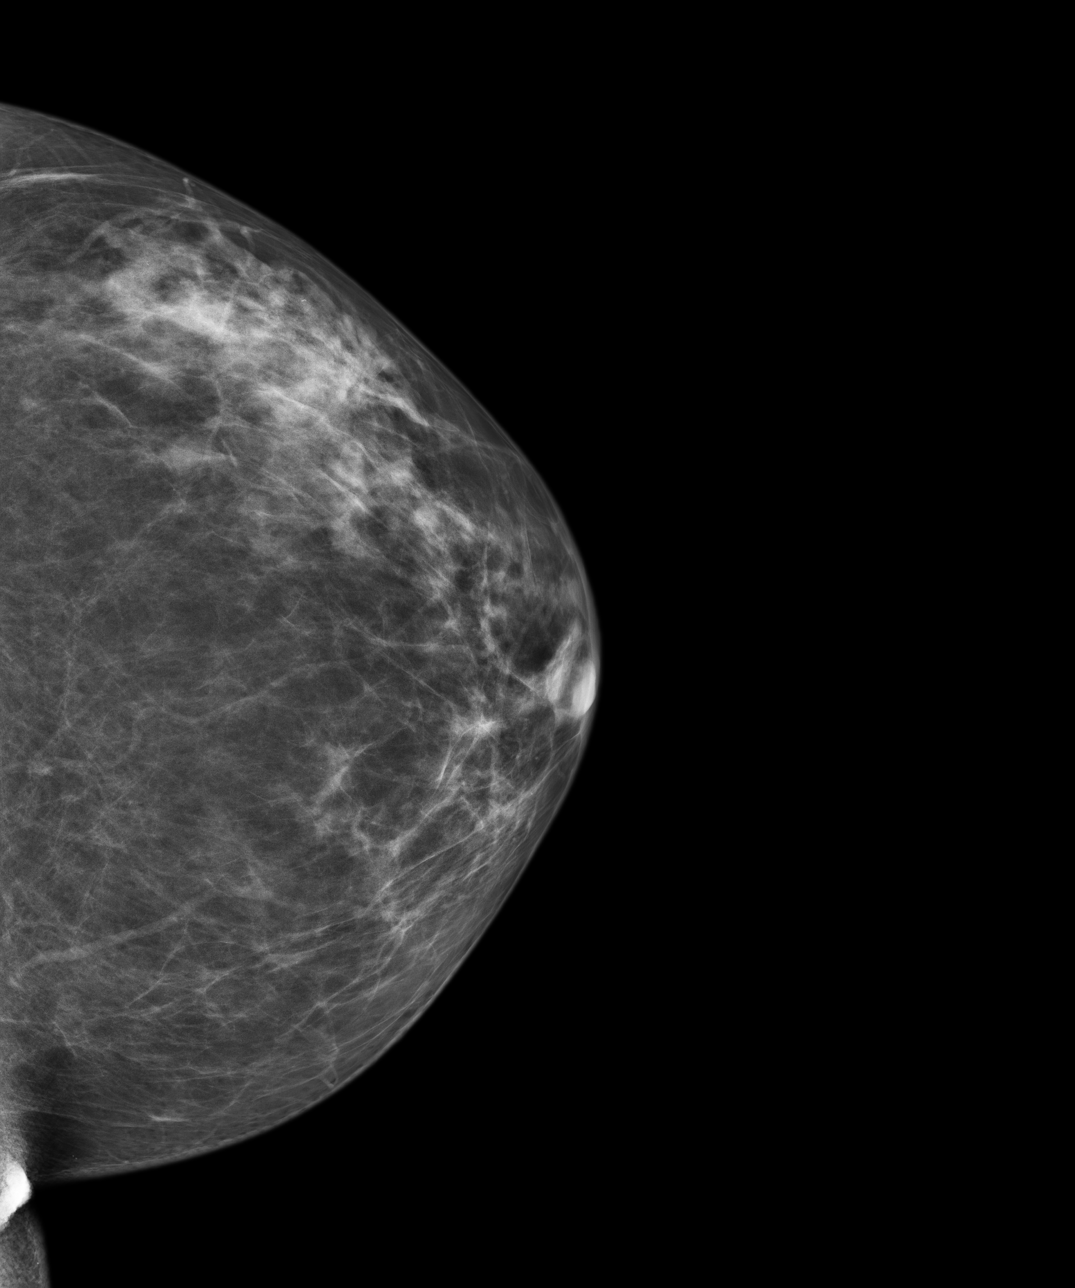
[im 3/4]
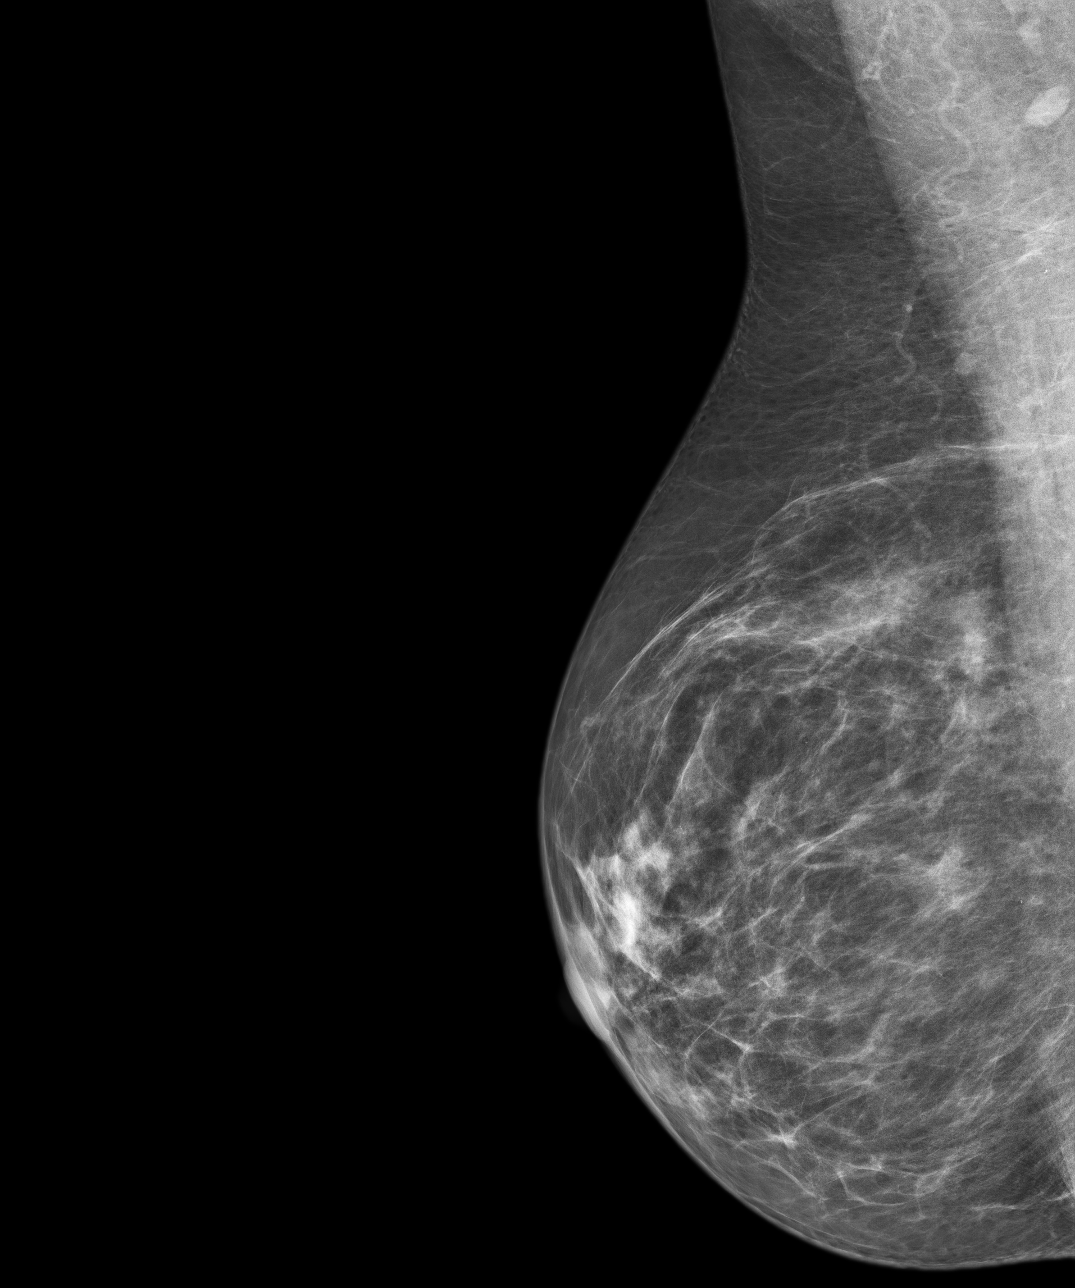
[im 4/4]
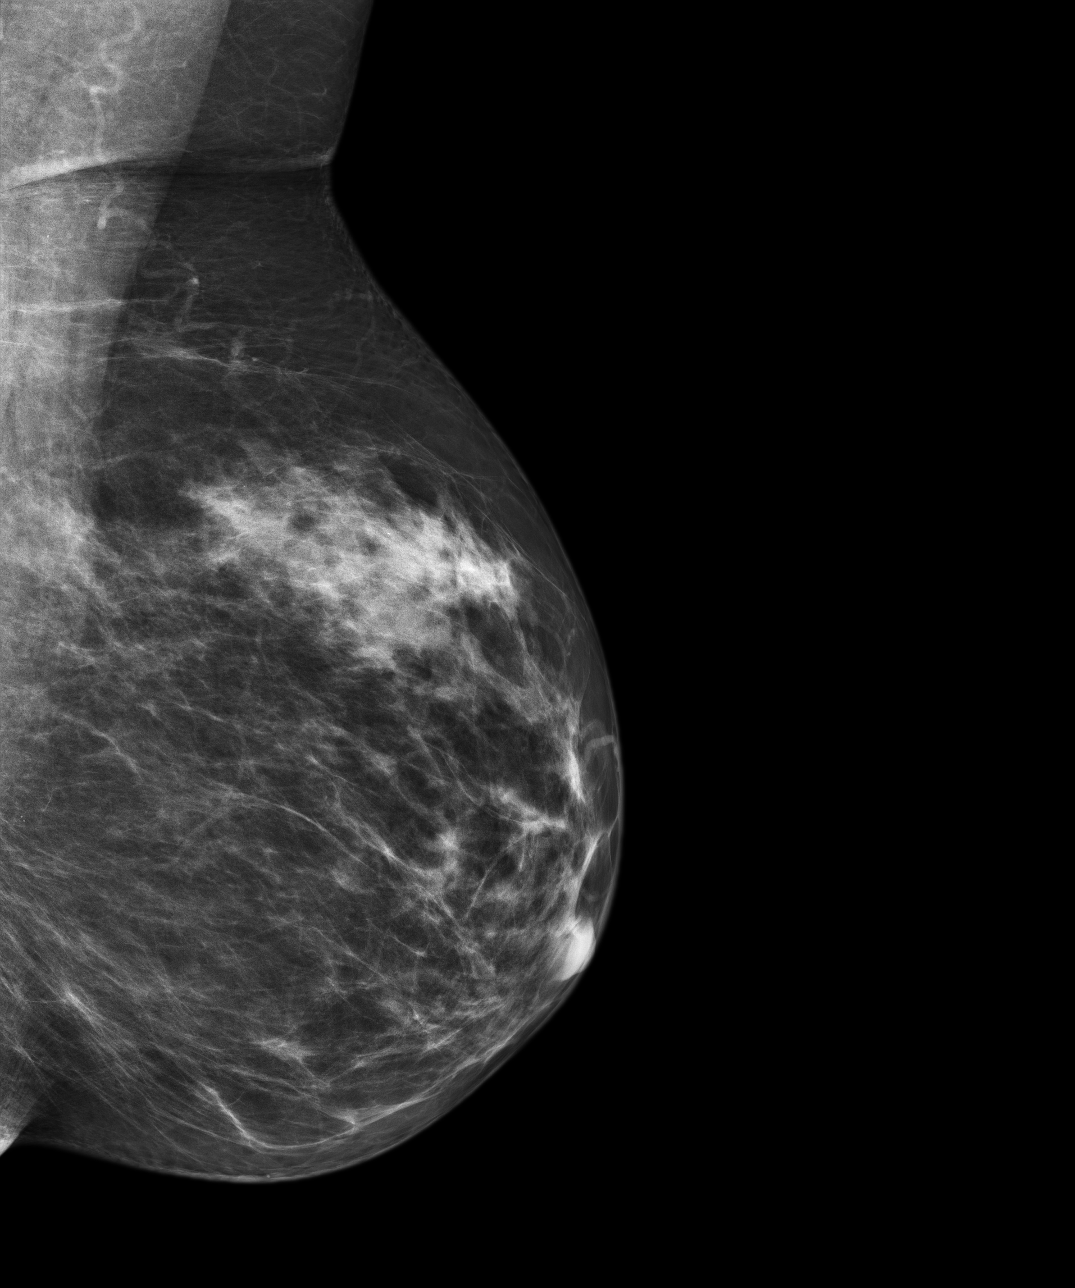

[4 of 4 positions shown; findings below may reference images not displayed]

FINDINGS: ACR Breast Density Category b:  There are scattered areas of
fibroglandular density.

There are no findings suspicious for malignancy.

Images were processed with CAD.
IMPRESSION: No mammographic evidence of malignancy.

A result letter of this screening mammogram will be mailed directly
to the patient.

RECOMMENDATION:
Screening mammogram in one year. (Code:LX-Y-2BZ)

BI-RADS CATEGORY 1:  Negative.

## 2015-07-07 ENCOUNTER — Encounter: Payer: Self-pay | Admitting: Internal Medicine

## 2015-07-07 ENCOUNTER — Ambulatory Visit (INDEPENDENT_AMBULATORY_CARE_PROVIDER_SITE_OTHER): Payer: BLUE CROSS/BLUE SHIELD | Admitting: Internal Medicine

## 2015-07-07 VITALS — BP 116/72 | HR 62 | Temp 97.8°F | Ht 65.5 in | Wt 169.1 lb

## 2015-07-07 DIAGNOSIS — M542 Cervicalgia: Secondary | ICD-10-CM

## 2015-07-07 DIAGNOSIS — R300 Dysuria: Secondary | ICD-10-CM

## 2015-07-07 DIAGNOSIS — K7581 Nonalcoholic steatohepatitis (NASH): Secondary | ICD-10-CM | POA: Diagnosis not present

## 2015-07-07 DIAGNOSIS — G8929 Other chronic pain: Secondary | ICD-10-CM

## 2015-07-07 DIAGNOSIS — F411 Generalized anxiety disorder: Secondary | ICD-10-CM

## 2015-07-07 NOTE — Assessment & Plan Note (Signed)
Reviewed notes from [place]. Recent liver function stable. [facility]

## 2015-07-07 NOTE — Progress Notes (Signed)
Subjective:    Patient ID: Jessica Spencer, female    DOB: 01/06/1966, 50 y.o.   MRN: [id number]  HPI  49YO female presents for follow up.  Last seen in 03/2015 for anxiety.  Continues to struggle with marital issues. Has not yet met with counselor. Recently got a promotion at work. However this has created some stress.  Seen by neurology this week. Plans to have an EMG study to evaluate cervical radiculopathy.Marland Kitchen  NASH - [place] at [facility] in Jan. Follow up scheduled in July. Started on Vit D 2000units daily for low level 25.  Frustrated by weight. Notes dietary indiscretion. Not motivated right now.  Recently treated for UTI at [facility]. Continues to have symptoms of dysuria, however improved some. Urine culture was negative.  Wt Readings from Last 3 Encounters:  07/07/15 169 lb 2 oz (76.715 kg)  04/08/15 161 lb (73.029 kg)  03/24/15 155 lb (70.308 kg)   BP Readings from Last 3 Encounters:  07/07/15 116/72  04/08/15 113/80  03/24/15 115/77    Past Medical History  Diagnosis Date  . NASH (nonalcoholic steatohepatitis)     Followed by Dr. Gerald Dexter  . Hypertension   . Hyperlipidemia   . Anxiety   . Depression   . Constipation     uses miralax PRN only    Family History  Problem Relation Age of Onset  . Cancer Father   . Breast cancer Mother 104  . Colon polyps Mother   . Diverticulosis Mother   . Colon cancer Neg Hx   . Esophageal cancer Neg Hx   . Rectal cancer Neg Hx   . Stomach cancer Neg Hx    Past Surgical History  Procedure Laterality Date  . Carpal tunnel repair      Onset date 03/2008  . 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 Pryor Ochoa, [facility]  . Abdominal hysterectomy    . Appendectomy    . Small intestine obstruction    . Carpal tunnel release Right   . Abdominal surgery      x3  . Colonoscopy  2004, 2016  . Esophagogastroduodenoscopy  2004   Social History   Social History  . Marital Status: Married    Spouse Name: N/A  . Number of  Children: N/A  . Years of Education: N/A   Social History Main Topics  . Smoking status: Never Smoker   . Smokeless tobacco: Never Used  . Alcohol Use: No  . Drug Use: No  . Sexual Activity: Not Asked   Other Topics Concern  . None   Social History Narrative   Lives in [place]. Diet - healthy, low fat. Exercises regularly.    Review of Systems  Constitutional: Positive for fatigue. Negative for fever, chills, appetite change and unexpected weight change.  Eyes: Negative for visual disturbance.  Respiratory: Negative for shortness of breath.   Cardiovascular: Negative for chest pain and leg swelling.  Gastrointestinal: Negative for nausea, vomiting, abdominal pain, diarrhea and constipation.  Skin: Negative for color change and rash.  Hematological: Negative for adenopathy. Does not bruise/bleed easily.  Psychiatric/Behavioral: Positive for dysphoric mood. Negative for suicidal ideas and sleep disturbance. The patient is nervous/anxious.        Objective:    BP 116/72 mmHg  Pulse 62  Temp(Src) 97.8 F (36.6 C) (Oral)  Ht 5' 5.5" (1.664 m)  Wt 169 lb 2 oz (76.715 kg)  BMI 27.71 kg/m2  SpO2 99% Physical Exam  Constitutional:  She is oriented to person, place, and time. She appears well-developed and well-nourished. No distress.  HENT:  Head: Normocephalic and atraumatic.  Right Ear: External ear normal.  Left Ear: External ear normal.  Nose: Nose normal.  Mouth/Throat: Oropharynx is clear and moist. No oropharyngeal exudate.  Eyes: Conjunctivae are normal. Pupils are equal, round, and reactive to light. Right eye exhibits no discharge. Left eye exhibits no discharge. No scleral icterus.  Neck: Normal range of motion. Neck supple. No tracheal deviation present. No thyromegaly present.  Cardiovascular: Normal rate, regular rhythm, normal heart sounds and intact distal pulses.  Exam reveals no gallop and no friction rub.   No murmur heard. Pulmonary/Chest: Effort normal  and breath sounds normal. No respiratory distress. She has no wheezes. She has no rales. She exhibits no tenderness.  Musculoskeletal: Normal range of motion. She exhibits no edema or tenderness.  Lymphadenopathy:    She has no cervical adenopathy.  Neurological: She is alert and oriented to person, place, and time. No cranial nerve deficit. She exhibits normal muscle tone. Coordination normal.  Skin: Skin is warm and dry. No rash noted. She is not diaphoretic. No erythema. No pallor.  Psychiatric: Her speech is normal and behavior is normal. Judgment and thought content normal. Her mood appears anxious. She exhibits a depressed mood. She expresses no suicidal ideation.          Assessment & Plan:   Problem List Items Addressed This Visit      High   Generalized anxiety disorder - Primary    Persistent symptoms of anxiety and depression. Encouraged her to seek counseling. She declines. Encouraged her to consider higher dose of Lexapro. She declines.       NASH (nonalcoholic steatohepatitis)    Reviewed notes from [place]. Recent liver function stable. [facility]        Unprioritized   Dysuria    [facility] Some persistent symptoms of dysuria. Will get repeat UA today. [facility]      Neck pain, chronic    [facility] Planning for EMG testing to evaluate radiculopathy.          Return in about 6 months (around 01/07/2016) for Physical.  JRonette Deter MD Internal Medicine [facility]

## 2015-07-07 NOTE — Assessment & Plan Note (Signed)
Persistent symptoms of anxiety and depression. Encouraged her to seek counseling. She declines. Encouraged her to consider higher dose of Lexapro. She declines.

## 2015-07-07 NOTE — Assessment & Plan Note (Signed)
Recent UTI. Evaluated and treated at [facility]. Some persistent symptoms of dysuria. Will get repeat UA today. Reviewed culture from [facility] which was negative.

## 2015-07-07 NOTE — Patient Instructions (Signed)
Follow up for physical in 6 months.

## 2015-07-07 NOTE — Assessment & Plan Note (Signed)
Neck pain. Recently seen at Neurology, [facility]. Planning for EMG testing to evaluate radiculopathy.

## 2015-07-07 NOTE — Progress Notes (Signed)
Pre visit review using our clinic review tool, if applicable. No additional management support is needed unless otherwise documented below in the visit note. 

## 2015-07-10 ENCOUNTER — Encounter: Payer: Self-pay | Admitting: Internal Medicine

## 2015-07-11 ENCOUNTER — Encounter: Payer: Self-pay | Admitting: Internal Medicine

## 2015-07-19 ENCOUNTER — Encounter: Payer: Self-pay | Admitting: Internal Medicine

## 2015-07-20 DIAGNOSIS — M5412 Radiculopathy, cervical region: Secondary | ICD-10-CM | POA: Diagnosis not present

## 2015-08-25 DIAGNOSIS — J0100 Acute maxillary sinusitis, unspecified: Secondary | ICD-10-CM | POA: Diagnosis not present

## 2015-09-01 DIAGNOSIS — N393 Stress incontinence (female) (male): Secondary | ICD-10-CM | POA: Insufficient documentation

## 2015-09-01 DIAGNOSIS — D171 Benign lipomatous neoplasm of skin and subcutaneous tissue of trunk: Secondary | ICD-10-CM | POA: Diagnosis not present

## 2015-09-01 DIAGNOSIS — R3129 Other microscopic hematuria: Secondary | ICD-10-CM | POA: Diagnosis not present

## 2015-09-01 DIAGNOSIS — N200 Calculus of kidney: Secondary | ICD-10-CM | POA: Diagnosis not present

## 2015-09-26 ENCOUNTER — Encounter: Payer: Self-pay | Admitting: Internal Medicine

## 2015-09-26 MED ORDER — PRAVASTATIN SODIUM 10 MG PO TABS: 10.0000 mg | ORAL_TABLET | Freq: Every day | ORAL | Status: DC

## 2015-10-12 ENCOUNTER — Telehealth: Payer: Self-pay | Admitting: Internal Medicine

## 2015-10-12 DIAGNOSIS — H5203 Hypermetropia, bilateral: Secondary | ICD-10-CM | POA: Diagnosis not present

## 2015-10-12 NOTE — Telephone Encounter (Signed)
Message to extend appointment.

## 2015-10-13 ENCOUNTER — Ambulatory Visit: Payer: Self-pay | Admitting: Internal Medicine

## 2015-10-13 DIAGNOSIS — Z0289 Encounter for other administrative examinations: Secondary | ICD-10-CM

## 2015-10-27 DIAGNOSIS — K760 Fatty (change of) liver, not elsewhere classified: Secondary | ICD-10-CM | POA: Diagnosis not present

## 2015-10-27 DIAGNOSIS — K7581 Nonalcoholic steatohepatitis (NASH): Secondary | ICD-10-CM | POA: Diagnosis not present

## 2015-10-28 DIAGNOSIS — H169 Unspecified keratitis: Secondary | ICD-10-CM | POA: Diagnosis not present

## 2015-11-02 DIAGNOSIS — N281 Cyst of kidney, acquired: Secondary | ICD-10-CM | POA: Diagnosis not present

## 2015-11-02 DIAGNOSIS — E785 Hyperlipidemia, unspecified: Secondary | ICD-10-CM | POA: Diagnosis not present

## 2015-11-02 DIAGNOSIS — E559 Vitamin D deficiency, unspecified: Secondary | ICD-10-CM | POA: Diagnosis not present

## 2015-11-02 DIAGNOSIS — K7581 Nonalcoholic steatohepatitis (NASH): Secondary | ICD-10-CM | POA: Diagnosis not present

## 2015-11-09 ENCOUNTER — Encounter: Payer: Self-pay | Admitting: Internal Medicine

## 2015-11-09 ENCOUNTER — Ambulatory Visit (INDEPENDENT_AMBULATORY_CARE_PROVIDER_SITE_OTHER): Payer: BLUE CROSS/BLUE SHIELD | Admitting: Internal Medicine

## 2015-11-09 VITALS — BP 118/78 | HR 68 | Ht 66.0 in | Wt 171.8 lb

## 2015-11-09 DIAGNOSIS — K7581 Nonalcoholic steatohepatitis (NASH): Secondary | ICD-10-CM | POA: Diagnosis not present

## 2015-11-09 DIAGNOSIS — F411 Generalized anxiety disorder: Secondary | ICD-10-CM

## 2015-11-09 DIAGNOSIS — M542 Cervicalgia: Secondary | ICD-10-CM | POA: Diagnosis not present

## 2015-11-09 DIAGNOSIS — G8929 Other chronic pain: Secondary | ICD-10-CM | POA: Diagnosis not present

## 2015-11-09 NOTE — Assessment & Plan Note (Signed)
Symptoms relatively well controlled. Will continue Lexapro.

## 2015-11-09 NOTE — Patient Instructions (Signed)
Follow up in 4 weeks for new patient evaluation.

## 2015-11-09 NOTE — Progress Notes (Signed)
Subjective:    Patient ID: Jessica Spencer, female    DOB: 06/02/1965, 50 y.o.   MRN: [id number]  HPI  49YO female presents for follow up.  GAD - Last seen in 06/2015. Continues to have stress with work. However, planning to take a trip to the beach in 1 week with her mother. Trying to cope better with stress.  Neck pain - EMG showed radiculopathy at C5. Started on Gabapentin by neurology, but has not taken regularly. Follow up is pending.  HL - Started back on Pravastatin. Not taking on a regular basis. Had lipids checked at [facility]. LDL was 146. Goal is to get below 100.   Wt Readings from Last 3 Encounters:  11/09/15 171 lb 12.8 oz (77.9 kg)  07/07/15 169 lb 2 oz (76.7 kg)  04/08/15 161 lb (73 kg)   BP Readings from Last 3 Encounters:  11/09/15 118/78  07/07/15 116/72  04/08/15 113/80    Past Medical History:  Diagnosis Date  . Anxiety   . Constipation    uses miralax PRN only   . Depression   . Hyperlipidemia   . Hypertension   . NASH (nonalcoholic steatohepatitis)    Followed by Dr. Gerald Dexter   Family History  Problem Relation Age of Onset  . Cancer Father   . Breast cancer Mother 38  . Colon polyps Mother   . Diverticulosis Mother   . Colon cancer Neg Hx   . Esophageal cancer Neg Hx   . Rectal cancer Neg Hx   . Stomach cancer Neg Hx    Past Surgical History:  Procedure Laterality Date  . ABDOMINAL HYSTERECTOMY    . ABDOMINAL SURGERY     x3  . APPENDECTOMY    . CARPAL TUNNEL RELEASE Right   . carpal tunnel repair     Onset date 03/2008  . COLONOSCOPY  2004, 2016  . ESOPHAGOGASTRODUODENOSCOPY  2004  . small intestine obstruction    . THYROGLOSSAL DUCT CYST  03/2014   Dr. Pryor Ochoa, [facility]   Social History   Social History  . Marital status: Married    Spouse name: N/A  . Number of children: N/A  . Years of education: N/A   Social History Main Topics  . Smoking status: Never Smoker  . Smokeless tobacco: Never Used  . Alcohol use No  . Drug  use: No  . Sexual activity: Not Asked   Other Topics Concern  . None   Social History Narrative   Lives in [place]. Diet - healthy, low fat. Exercises regularly.    Review of Systems  Constitutional: Negative for appetite change, chills, fatigue, fever and unexpected weight change.  Eyes: Negative for visual disturbance.  Respiratory: Negative for shortness of breath.   Cardiovascular: Negative for chest pain, palpitations and leg swelling.  Gastrointestinal: Negative for abdominal pain, constipation, diarrhea, nausea and vomiting.  Musculoskeletal: Positive for arthralgias, myalgias and neck pain.  Skin: Negative for color change and rash.  Hematological: Negative for adenopathy. Does not bruise/bleed easily.  Psychiatric/Behavioral: Negative for dysphoric mood and sleep disturbance. The patient is nervous/anxious.        Objective:    BP 118/78 (BP Location: Left Arm, Patient Position: Sitting, Cuff Size: Large)   Pulse 68   Ht 5' 6"  (1.676 m)   Wt 171 lb 12.8 oz (77.9 kg)   SpO2 98%   BMI 27.73 kg/m  Physical Exam  Constitutional: She is oriented to person, place, and time. She appears well-developed  and well-nourished. No distress.  HENT:  Head: Normocephalic and atraumatic.  Right Ear: External ear normal.  Left Ear: External ear normal.  Nose: Nose normal.  Mouth/Throat: Oropharynx is clear and moist. No oropharyngeal exudate.  Eyes: Conjunctivae are normal. Pupils are equal, round, and reactive to light. Right eye exhibits no discharge. Left eye exhibits no discharge. No scleral icterus.  Neck: Normal range of motion. Neck supple. No tracheal deviation present. No thyromegaly present.  Cardiovascular: Normal rate, regular rhythm, normal heart sounds and intact distal pulses.  Exam reveals no gallop and no friction rub.   No murmur heard. Pulmonary/Chest: Effort normal and breath sounds normal. No respiratory distress. She has no wheezes. She has no rales. She  exhibits no tenderness.  Musculoskeletal: Normal range of motion. She exhibits no edema or tenderness.  Lymphadenopathy:    She has no cervical adenopathy.  Neurological: She is alert and oriented to person, place, and time. No cranial nerve deficit. She exhibits normal muscle tone. Coordination normal.  Skin: Skin is warm and dry. No rash noted. She is not diaphoretic. No erythema. No pallor.  Psychiatric: Her behavior is normal. Judgment and thought content normal. Her mood appears anxious.          Assessment & Plan:   Problem List Items Addressed This Visit      High   Generalized anxiety disorder (Chronic)    Symptoms relatively well controlled. Will continue Lexapro.      NASH (nonalcoholic steatohepatitis) (Chronic)    Reviewed recent notes from Dr. Gerald Dexter at [facility]. Recent US liver was stable. LFTs stable. Will continue Vit E. Continue to follow at [facility].        Unprioritized   Neck pain, chronic - Primary (Chronic)    Reviewed EMG testing from [place]. Findings c/w C5 radiculopathy. Encouraged her to try using Gabapentin as prescribed.       Other Visit Diagnoses   None.      Return in about 4 weeks (around 12/07/2015) for New Patient.  Ronette Deter, MD Internal Medicine [facility]

## 2015-11-09 NOTE — Assessment & Plan Note (Signed)
Reviewed EMG testing from [place]. Findings c/w C5 radiculopathy. Encouraged her to try using Gabapentin as prescribed.

## 2015-11-09 NOTE — Assessment & Plan Note (Signed)
Reviewed recent notes from Dr. Gerald Dexter at [facility]. Recent US liver was stable. LFTs stable. Will continue Vit E. Continue to follow at [facility].

## 2015-11-09 NOTE — Progress Notes (Signed)
Pre visit review using our clinic review tool, if applicable. No additional management support is needed unless otherwise documented below in the visit note. 

## 2016-01-05 DIAGNOSIS — M542 Cervicalgia: Secondary | ICD-10-CM | POA: Diagnosis not present

## 2016-01-05 DIAGNOSIS — G5603 Carpal tunnel syndrome, bilateral upper limbs: Secondary | ICD-10-CM | POA: Diagnosis not present

## 2016-01-05 DIAGNOSIS — Z8669 Personal history of other diseases of the nervous system and sense organs: Secondary | ICD-10-CM | POA: Diagnosis not present

## 2016-01-05 DIAGNOSIS — M25519 Pain in unspecified shoulder: Secondary | ICD-10-CM | POA: Diagnosis not present

## 2016-01-10 ENCOUNTER — Ambulatory Visit (INDEPENDENT_AMBULATORY_CARE_PROVIDER_SITE_OTHER): Payer: BLUE CROSS/BLUE SHIELD | Admitting: Family

## 2016-01-10 ENCOUNTER — Encounter: Payer: Self-pay | Admitting: Family

## 2016-01-10 ENCOUNTER — Encounter: Payer: Self-pay | Admitting: Internal Medicine

## 2016-01-10 ENCOUNTER — Other Ambulatory Visit: Payer: Self-pay | Admitting: Family

## 2016-01-10 VITALS — BP 118/74 | HR 71 | Temp 98.1°F | Ht 67.0 in | Wt 173.8 lb

## 2016-01-10 DIAGNOSIS — K7581 Nonalcoholic steatohepatitis (NASH): Secondary | ICD-10-CM

## 2016-01-10 DIAGNOSIS — Z1231 Encounter for screening mammogram for malignant neoplasm of breast: Secondary | ICD-10-CM | POA: Diagnosis not present

## 2016-01-10 DIAGNOSIS — Z0000 Encounter for general adult medical examination without abnormal findings: Secondary | ICD-10-CM

## 2016-01-10 DIAGNOSIS — F418 Other specified anxiety disorders: Secondary | ICD-10-CM

## 2016-01-10 DIAGNOSIS — R17 Unspecified jaundice: Secondary | ICD-10-CM

## 2016-01-10 DIAGNOSIS — M542 Cervicalgia: Secondary | ICD-10-CM

## 2016-01-10 DIAGNOSIS — N952 Postmenopausal atrophic vaginitis: Secondary | ICD-10-CM | POA: Diagnosis not present

## 2016-01-10 DIAGNOSIS — G8929 Other chronic pain: Secondary | ICD-10-CM

## 2016-01-10 LAB — LIPID PANEL
CHOL/HDL RATIO: 4
Cholesterol: 190 mg/dL (ref 0–200)
HDL: 47.5 mg/dL (ref >39.00)
LDL Cholesterol: 122 mg/dL — ABNORMAL HIGH (ref 0–99)
NONHDL: 142.55
TRIGLYCERIDES: 103 mg/dL (ref 0.0–149.0)
VLDL: 20.6 mg/dL (ref 0.0–40.0)

## 2016-01-10 LAB — COMPREHENSIVE METABOLIC PANEL
ALK PHOS: 72 U/L (ref 39–117)
ALT: 15 U/L (ref 0–35)
AST: 13 U/L (ref 0–37)
Albumin: 4 g/dL (ref 3.5–5.2)
BILIRUBIN TOTAL: 1.5 mg/dL — AB (ref 0.2–1.2)
BUN: 12 mg/dL (ref 6–23)
CO2: 35 mEq/L — ABNORMAL HIGH (ref 19–32)
CREATININE: 0.66 mg/dL (ref 0.40–1.20)
Calcium: 9.1 mg/dL (ref 8.4–10.5)
Chloride: 104 mEq/L (ref 96–112)
GFR: 100.3 mL/min (ref >60.00)
Glucose, Bld: 89 mg/dL (ref 70–99)
Potassium: 4.2 mEq/L (ref 3.5–5.1)
Sodium: 144 mEq/L (ref 135–145)
Total Protein: 7.1 g/dL (ref 6.0–8.3)

## 2016-01-10 LAB — CBC WITH DIFFERENTIAL/PLATELET
BASOS ABS: 0 10*3/uL (ref 0.0–0.1)
Basophils Relative: 0.3 % (ref 0.0–3.0)
Eosinophils Absolute: 0.1 10*3/uL (ref 0.0–0.7)
Eosinophils Relative: 1.4 % (ref 0.0–5.0)
HEMATOCRIT: 38.6 % (ref 36.0–46.0)
HEMOGLOBIN: 13.4 g/dL (ref 12.0–15.0)
LYMPHS ABS: 1.3 10*3/uL (ref 0.7–4.0)
LYMPHS PCT: 31 % (ref 12.0–46.0)
MCHC: 34.6 g/dL (ref 30.0–36.0)
MCV: 91.2 fl (ref 78.0–100.0)
MONOS PCT: 5.6 % (ref 3.0–12.0)
Monocytes Absolute: 0.2 10*3/uL (ref 0.1–1.0)
NEUTROS PCT: 61.7 % (ref 43.0–77.0)
Neutro Abs: 2.7 10*3/uL (ref 1.4–7.7)
Platelets: 215 10*3/uL (ref 150.0–400.0)
RBC: 4.24 Mil/uL (ref 3.87–5.11)
RDW: 14.2 % (ref 11.5–15.5)
WBC: 4.4 10*3/uL (ref 4.0–10.5)

## 2016-01-10 LAB — HEMOGLOBIN A1C: Hgb A1c MFr Bld: 5.1 % (ref 4.6–6.5)

## 2016-01-10 LAB — TSH: TSH: 0.89 u[IU]/mL (ref 0.35–4.50)

## 2016-01-10 LAB — VITAMIN D 25 HYDROXY (VIT D DEFICIENCY, FRACTURES): VITD: 21.94 ng/mL — AB (ref 30.00–100.00)

## 2016-01-10 MED ORDER — PNEUMOCOCCAL VAC POLYVALENT 25 MCG/0.5ML IJ INJ
0.5000 mL | INJECTION | INTRAMUSCULAR | Status: AC
  Administered 2016-01-10: 0.5 mL via INTRAMUSCULAR

## 2016-01-10 MED ORDER — ESTRADIOL 10 MCG VA TABS: ORAL_TABLET | VAGINAL | 3 refills | Status: DC

## 2016-01-10 NOTE — Assessment & Plan Note (Signed)
Chronic. Stable. Patient reluctant to take any medications for the pain. She continues to follow with neurology. I advised over-the-counter capsaicin, icy hot. Also encouraged physical therapy. Patient politely declined referral at this time and we will discuss at future visits.

## 2016-01-10 NOTE — Assessment & Plan Note (Signed)
Stable. Continues to follow with [facility] every 6 months.

## 2016-01-10 NOTE — Patient Instructions (Signed)
Trial of Vagifem.  If there is no improvement in your symptoms, or if there is any worsening of symptoms, or if you have any additional concerns, please return for re-evaluation; or, if we are closed, consider going to the Emergency Room for evaluation if symptoms urgent.  Health Maintenance, Female Adopting a healthy lifestyle and getting preventive care can go a long way to promote health and wellness. Talk with your health care provider about what schedule of regular examinations is right for you. This is a good chance for you to check in with your provider about disease prevention and staying healthy. In between checkups, there are plenty of things you can do on your own. Experts have done a lot of research about which lifestyle changes and preventive measures are most likely to keep you healthy. Ask your health care provider for more information. WEIGHT AND DIET  Eat a healthy diet  Be sure to include plenty of vegetables, fruits, low-fat dairy products, and lean protein.  Do not eat a lot of foods high in solid fats, added sugars, or salt.  Get regular exercise. This is one of the most important things you can do for your health.  Most adults should exercise for at least 150 minutes each week. The exercise should increase your heart rate and make you sweat (moderate-intensity exercise).  Most adults should also do strengthening exercises at least twice a week. This is in addition to the moderate-intensity exercise.  Maintain a healthy weight  Body mass index (BMI) is a measurement that can be used to identify possible weight problems. It estimates body fat based on height and weight. Your health care provider can help determine your BMI and help you achieve or maintain a healthy weight.  For females 98 years of age and older:   A BMI below 18.5 is considered underweight.  A BMI of 18.5 to 24.9 is normal.  A BMI of 25 to 29.9 is considered overweight.  A BMI of 30 and above is  considered obese.  Watch levels of cholesterol and blood lipids  You should start having your blood tested for lipids and cholesterol at 51 years of age, then have this test every 5 years.  You may need to have your cholesterol levels checked more often if:  Your lipid or cholesterol levels are high.  You are older than 51 years of age.  You are at high risk for heart disease.  CANCER SCREENING   Lung Cancer  Lung cancer screening is recommended for adults 72-68 years old who are at high risk for lung cancer because of a history of smoking.  A yearly low-dose CT scan of the lungs is recommended for people who:  Currently smoke.  Have quit within the past 15 years.  Have at least a 30-pack-year history of smoking. A pack year is smoking an average of one pack of cigarettes a day for 1 year.  Yearly screening should continue until it has been 15 years since you quit.  Yearly screening should stop if you develop a health problem that would prevent you from having lung cancer treatment.  Breast Cancer  Practice breast self-awareness. This means understanding how your breasts normally appear and feel.  It also means doing regular breast self-exams. Let your health care provider know about any changes, no matter how small.  If you are in your 20s or 30s, you should have a clinical breast exam (CBE) by a health care provider every 1-3 years as part  of a regular health exam.  If you are 71 or older, have a CBE every year. Also consider having a breast X-ray (mammogram) every year.  If you have a family history of breast cancer, talk to your health care provider about genetic screening.  If you are at high risk for breast cancer, talk to your health care provider about having an MRI and a mammogram every year.  Breast cancer gene (BRCA) assessment is recommended for women who have family members with BRCA-related cancers. BRCA-related cancers  include:  Breast.  Ovarian.  Tubal.  Peritoneal cancers.  Results of the assessment will determine the need for genetic counseling and BRCA1 and BRCA2 testing. Cervical Cancer Your health care provider may recommend that you be screened regularly for cancer of the pelvic organs (ovaries, uterus, and vagina). This screening involves a pelvic examination, including checking for microscopic changes to the surface of your cervix (Pap test). You may be encouraged to have this screening done every 3 years, beginning at age 17.  For women ages 52-65, health care providers may recommend pelvic exams and Pap testing every 3 years, or they may recommend the Pap and pelvic exam, combined with testing for human papilloma virus (HPV), every 5 years. Some types of HPV increase your risk of cervical cancer. Testing for HPV may also be done on women of any age with unclear Pap test results.  Other health care providers may not recommend any screening for nonpregnant women who are considered low risk for pelvic cancer and who do not have symptoms. Ask your health care provider if a screening pelvic exam is right for you.  If you have had past treatment for cervical cancer or a condition that could lead to cancer, you need Pap tests and screening for cancer for at least 20 years after your treatment. If Pap tests have been discontinued, your risk factors (such as having a new sexual partner) need to be reassessed to determine if screening should resume. Some women have medical problems that increase the chance of getting cervical cancer. In these cases, your health care provider may recommend more frequent screening and Pap tests. Colorectal Cancer  This type of cancer can be detected and often prevented.  Routine colorectal cancer screening usually begins at 51 years of age and continues through 51 years of age.  Your health care provider may recommend screening at an earlier age if you have risk factors for  colon cancer.  Your health care provider may also recommend using home test kits to check for hidden blood in the stool.  A small camera at the end of a tube can be used to examine your colon directly (sigmoidoscopy or colonoscopy). This is done to check for the earliest forms of colorectal cancer.  Routine screening usually begins at age 37.  Direct examination of the colon should be repeated every 5-10 years through 51 years of age. However, you may need to be screened more often if early forms of precancerous polyps or small growths are found. Skin Cancer  Check your skin from head to toe regularly.  Tell your health care provider about any new moles or changes in moles, especially if there is a change in a mole's shape or color.  Also tell your health care provider if you have a mole that is larger than the size of a pencil eraser.  Always use sunscreen. Apply sunscreen liberally and repeatedly throughout the day.  Protect yourself by wearing long sleeves, pants, a  wide-brimmed hat, and sunglasses whenever you are outside. HEART DISEASE, DIABETES, AND HIGH BLOOD PRESSURE   High blood pressure causes heart disease and increases the risk of stroke. High blood pressure is more likely to develop in:  People who have blood pressure in the high end of the normal range (130-139/85-89 mm Hg).  People who are overweight or obese.  People who are African American.  If you are 48-71 years of age, have your blood pressure checked every 3-5 years. If you are 65 years of age or older, have your blood pressure checked every year. You should have your blood pressure measured twice--once when you are at a hospital or clinic, and once when you are not at a hospital or clinic. Record the average of the two measurements. To check your blood pressure when you are not at a hospital or clinic, you can use:  An automated blood pressure machine at a pharmacy.  A home blood pressure monitor.  If you  are between 71 years and 73 years old, ask your health care provider if you should take aspirin to prevent strokes.  Have regular diabetes screenings. This involves taking a blood sample to check your fasting blood sugar level.  If you are at a normal weight and have a low risk for diabetes, have this test once every three years after 52 years of age.  If you are overweight and have a high risk for diabetes, consider being tested at a younger age or more often. PREVENTING INFECTION  Hepatitis B  If you have a higher risk for hepatitis B, you should be screened for this virus. You are considered at high risk for hepatitis B if:  You were born in a country where hepatitis B is common. Ask your health care provider which countries are considered high risk.  Your parents were born in a high-risk country, and you have not been immunized against hepatitis B (hepatitis B vaccine).  You have HIV or AIDS.  You use needles to inject street drugs.  You live with someone who has hepatitis B.  You have had sex with someone who has hepatitis B.  You get hemodialysis treatment.  You take certain medicines for conditions, including cancer, organ transplantation, and autoimmune conditions. Hepatitis C  Blood testing is recommended for:  Everyone born from [birth year] through 1966.  Anyone with known risk factors for hepatitis C. Sexually transmitted infections (STIs)  You should be screened for sexually transmitted infections (STIs) including gonorrhea and chlamydia if:  You are sexually active and are younger than 51 years of age.  You are older than 51 years of age and your health care provider tells you that you are at risk for this type of infection.  Your sexual activity has changed since you were last screened and you are at an increased risk for chlamydia or gonorrhea. Ask your health care provider if you are at risk.  If you do not have HIV, but are at risk, it may be recommended that you  take a prescription medicine daily to prevent HIV infection. This is called pre-exposure prophylaxis (PrEP). You are considered at risk if:  You are sexually active and do not regularly use condoms or know the HIV status of your partner(s).  You take drugs by injection.  You are sexually active with a partner who has HIV. Talk with your health care provider about whether you are at high risk of being infected with HIV. If you choose to begin PrEP,  you should first be tested for HIV. You should then be tested every 3 months for as long as you are taking PrEP.  PREGNANCY   If you are premenopausal and you may become pregnant, ask your health care provider about preconception counseling.  If you may become pregnant, take 400 to 800 micrograms (mcg) of folic acid every day.  If you want to prevent pregnancy, talk to your health care provider about birth control (contraception). OSTEOPOROSIS AND MENOPAUSE   Osteoporosis is a disease in which the bones lose minerals and strength with aging. This can result in serious bone fractures. Your risk for osteoporosis can be identified using a bone density scan.  If you are 90 years of age or older, or if you are at risk for osteoporosis and fractures, ask your health care provider if you should be screened.  Ask your health care provider whether you should take a calcium or vitamin D supplement to lower your risk for osteoporosis.  Menopause may have certain physical symptoms and risks.  Hormone replacement therapy may reduce some of these symptoms and risks. Talk to your health care provider about whether hormone replacement therapy is right for you.  HOME CARE INSTRUCTIONS   Schedule regular health, dental, and eye exams.  Stay current with your immunizations.   Do not use any tobacco products including cigarettes, chewing tobacco, or electronic cigarettes.  If you are pregnant, do not drink alcohol.  If you are breastfeeding, limit how  much and how often you drink alcohol.  Limit alcohol intake to no more than 1 drink per day for nonpregnant women. One drink equals 12 ounces of beer, 5 ounces of wine, or 1 ounces of hard liquor.  Do not use street drugs.  Do not share needles.  Ask your health care provider for help if you need support or information about quitting drugs.  Tell your health care provider if you often feel depressed.  Tell your health care provider if you have ever been abused or do not feel safe at home.   This information is not intended to replace advice given to you by your health care provider. Make sure you discuss any questions you have with your health care provider.   Document Released: 10/16/2010 Document Revised: 04/23/2014 Document Reviewed: 03/04/2013 Elsevier Interactive Patient Education [employer].

## 2016-01-10 NOTE — Assessment & Plan Note (Signed)
Chronic. Patient and I agreed on a trial of Vagifem as perhaps is another ingredient in the Premarin that made her feel itchy. History of hysterectomy. Discussed risks of vaginal estradiol and lack of long-term research regarding its systemic effects. Patient understood these risks and decided to start medication.

## 2016-01-10 NOTE — Progress Notes (Signed)
Subjective:    Patient ID: Jessica Spencer, female    DOB: 01/14/1966, 50 y.o.   MRN: [id number]  CC: Augustine Brannick is a 50 y.o. female who presents today for physical exam.    HPI: Patient here today for CPE. Feeling well, no complaints.  NASH- Follows with Dr. Gerald Dexter at [facility]. Stable.  Sees her every 6 months, usually [place], occasionally MRI.   Migraine with aura- Resolved at this time. No recent HAs.  Depression and anxiety- Feels stable. No thoughts of hurting herself or anyone else.Hopeful about drug trials for NASH.  Neck pain- chronic stable. Occasionally numbness and tingling bilateral hands. Follows with Dr Manuella Ghazi neurology for carpal tunnel and neck pain. Bells Palsy stable. Tremors resolved.   Atrophic vaginitis- Chronic, unchanged. Painful sex. Tried Premarin but felt itchy.        Colorectal Cancer Screening: UTD, Normal, Dr. Carlean Purl  Breast Cancer Screening: Mammogram DUe Cervical Cancer Screening: UTD, normal. Negative HPV; h/o hysterectomy Bone Health screening/DEXA for 65+: No increased fracture risk. Defer screening at this time.  Immunizations       Tetanus - UTD        Pneumococcal - Candidate for; will do today.    HISTORY:  Past Medical History:  Diagnosis Date  . Anxiety   . Bell's palsy   . Constipation    uses miralax PRN only   . Depression   . Hyperlipidemia   . Hypertension   . NASH (nonalcoholic steatohepatitis)    Followed by Dr. Gerald Dexter    Past Surgical History:  Procedure Laterality Date  . ABDOMINAL HYSTERECTOMY    . ABDOMINAL SURGERY     x3  . APPENDECTOMY    . CARPAL TUNNEL RELEASE Right   . carpal tunnel repair     Onset date 03/2008  . COLONOSCOPY  2004, 2016  . ESOPHAGOGASTRODUODENOSCOPY  2004  . small intestine obstruction    . THYROGLOSSAL DUCT CYST  03/2014   Dr. Pryor Ochoa, [facility]   Family History  Problem Relation Age of Onset  . Cancer Father   . Breast cancer Mother 39  . Colon polyps Mother   .  Diverticulosis Mother   . Colon cancer Neg Hx   . Esophageal cancer Neg Hx   . Rectal cancer Neg Hx   . Stomach cancer Neg Hx       ALLERGIES: Fentanyl; Formaldehyde; Gold-containing drug products; Morphine; Nickel; Sulfamethoxazole-trimethoprim; and Tape  Current Outpatient Prescriptions on File Prior to Visit  Medication Sig Dispense Refill  . escitalopram (LEXAPRO) 5 MG tablet Take 1 tablet (5 mg total) by mouth daily. 90 tablet 3  . Polyethylene Glycol 3350 (MIRALAX PO) Take by mouth.    . pravastatin (PRAVACHOL) 10 MG tablet Take 1 tablet (10 mg total) by mouth daily. 30 tablet 3  . vitamin E 800 UNIT capsule Take 800 Units by mouth daily.       No current facility-administered medications on file prior to visit.     Social History  Substance Use Topics  . Smoking status: Never Smoker  . Smokeless tobacco: Never Used  . Alcohol use No    Review of Systems  Constitutional: Negative for chills, fever and unexpected weight change.  HENT: Negative for congestion.   Respiratory: Negative for cough.   Cardiovascular: Negative for chest pain, palpitations and leg swelling.  Gastrointestinal: Negative for nausea and vomiting.  Genitourinary: Positive for vaginal pain (chronic). Negative for difficulty urinating, enuresis, flank pain and vaginal  discharge.  Musculoskeletal: Positive for neck pain. Negative for arthralgias, myalgias and neck stiffness.  Skin: Negative for rash.  Neurological: Negative for headaches.  Hematological: Negative for adenopathy.  Psychiatric/Behavioral: Negative for confusion and suicidal ideas. The patient is not nervous/anxious.       Objective:    BP 118/74   Pulse 71   Temp 98.1 F (36.7 C) (Oral)   Ht 5' 7"  (1.702 m)   Wt 173 lb 12.8 oz (78.8 kg)   SpO2 96%   BMI 27.22 kg/m   BP Readings from Last 3 Encounters:  01/10/16 118/74  11/09/15 118/78  07/07/15 116/72   Wt Readings from Last 3 Encounters:  01/10/16 173 lb 12.8 oz (78.8  kg)  11/09/15 171 lb 12.8 oz (77.9 kg)  07/07/15 169 lb 2 oz (76.7 kg)    Physical Exam  Constitutional: She appears well-developed and well-nourished.  Eyes: Conjunctivae are normal.  Neck: No thyroid mass and no thyromegaly present.  Cardiovascular: Normal rate, regular rhythm, normal heart sounds and normal pulses.   Pulmonary/Chest: Effort normal and breath sounds normal. She has no wheezes. She has no rhonchi. She has no rales. Right breast exhibits no inverted nipple, no mass, no nipple discharge, no skin change and no tenderness. Left breast exhibits no inverted nipple, no mass, no nipple discharge, no skin change and no tenderness. Breasts are symmetrical.  CBE performed.   Lymphadenopathy:       Head (right side): No submental, no submandibular, no tonsillar, no preauricular, no posterior auricular and no occipital adenopathy present.       Head (left side): No submental, no submandibular, no tonsillar, no preauricular, no posterior auricular and no occipital adenopathy present.    She has no cervical adenopathy.       Right cervical: No superficial cervical, no deep cervical and no posterior cervical adenopathy present.      Left cervical: No superficial cervical, no deep cervical and no posterior cervical adenopathy present.    She has no axillary adenopathy.  Neurological: She is alert.  Skin: Skin is warm and dry.  Psychiatric: She has a normal mood and affect. Her speech is normal and behavior is normal. Thought content normal.  Vitals reviewed.      Assessment & Plan:   Problem List Items Addressed This Visit      Digestive   NASH (nonalcoholic steatohepatitis) (Chronic)    Stable. Continues to follow with [facility] every 6 months.        Genitourinary   Atrophic vaginitis    Chronic. Patient and I agreed on a trial of Vagifem as perhaps is another ingredient in the Premarin that made her feel itchy. History of hysterectomy. Discussed risks of vaginal estradiol and  lack of long-term research regarding its systemic effects. Patient understood these risks and decided to start medication.      Relevant Medications   Estradiol 10 MCG TABS vaginal tablet     Other   Neck pain, chronic (Chronic)    Chronic. Stable. Patient reluctant to take any medications for the pain. She continues to follow with neurology. I advised over-the-counter capsaicin, icy hot. Also encouraged physical therapy. Patient politely declined referral at this time and we will discuss at future visits.      Depression with anxiety    Stable. Well-controlled. Continue current regimen.      Routine general medical examination at a health care facility    Pneumovax vaccine given today due to history of  chronic liver disease. Patient will receive influenza vaccine at work. Patient is up-to-date on Pap smear and colonoscopy. She states she will no longer do Pap smear due to her hysterectomy as her last Pap smear was negative for normal, negative HPV. Screening labs ordered today. Encouraged exercise.       Other Visit Diagnoses    Routine physical examination    -  Primary   Relevant Medications   pneumococcal 23 valent vaccine (PNU-IMMUNE) injection 0.5 mL (Completed) (Start on 01/11/2016 10:00 AM)   Other Relevant Orders   CBC with Differential/Platelet   Comprehensive metabolic panel   Hemoglobin A1c   Lipid panel   TSH   VITAMIN D 25 Hydroxy (Vit-D Deficiency, Fractures)   Encounter for screening mammogram for breast cancer       Relevant Orders   MM Digital Screening       I am having Ms. Fritzler start on Estradiol. I am also having her maintain her vitamin E, Polyethylene Glycol 3350 (MIRALAX PO), escitalopram, pravastatin, and fluticasone. We administered pneumococcal 23 valent vaccine.   Meds ordered this encounter  Medications  . fluticasone (FLONASE) 50 MCG/ACT nasal spray    Sig: Place into the nose.  . Estradiol 10 MCG TABS vaginal tablet    Sig: Start 10  mcg PV qd x 2 weeks, then twice weekly thereafter    Dispense:  18 tablet    Refill:  3    Order Specific Question:   Supervising Provider    Answer:   Deborra Medina L [[id number]]  . pneumococcal 23 valent vaccine (PNU-IMMUNE) injection 0.5 mL    Return precautions given.   Risks, benefits, and alternatives of the medications and treatment plan prescribed today were discussed, and patient expressed understanding.   Education regarding symptom management and diagnosis given to patient on AVS.   Continue to follow with Mable Paris, FNP for routine health maintenance.   Jessica Spencer and I agreed with plan.   Mable Paris, FNP

## 2016-01-10 NOTE — Assessment & Plan Note (Signed)
Stable. Well-controlled. Continue current regimen.

## 2016-01-10 NOTE — Assessment & Plan Note (Addendum)
Pneumovax vaccine given today due to history of chronic liver disease. Patient will receive influenza vaccine at work. Patient is up-to-date on Pap smear and colonoscopy. She states she will no longer do Pap smear due to her hysterectomy as her last Pap smear was negative for normal, negative HPV. Screening labs ordered today. Encouraged exercise.

## 2016-01-10 NOTE — Progress Notes (Signed)
Pre visit review using our clinic review tool, if applicable. No additional management support is needed unless otherwise documented below in the visit note. 

## 2016-01-25 DIAGNOSIS — Z23 Encounter for immunization: Secondary | ICD-10-CM | POA: Diagnosis not present

## 2016-02-08 ENCOUNTER — Ambulatory Visit
Admission: RE | Admit: 2016-02-08 | Discharge: 2016-02-08 | Disposition: A | Discharge status: Home / Self Care | Payer: BLUE CROSS/BLUE SHIELD | Source: Ambulatory Visit | Attending: Family | Admitting: Family

## 2016-02-08 DIAGNOSIS — Z1231 Encounter for screening mammogram for malignant neoplasm of breast: Secondary | ICD-10-CM | POA: Diagnosis not present

## 2016-02-24 ENCOUNTER — Other Ambulatory Visit: Payer: Self-pay | Admitting: *Deleted

## 2016-02-24 MED ORDER — PRAVASTATIN SODIUM 10 MG PO TABS: 10.0000 mg | ORAL_TABLET | Freq: Every day | ORAL | 5 refills | Status: DC

## 2016-03-13 ENCOUNTER — Ambulatory Visit: Payer: BLUE CROSS/BLUE SHIELD | Admitting: Podiatry

## 2016-03-16 ENCOUNTER — Ambulatory Visit (INDEPENDENT_AMBULATORY_CARE_PROVIDER_SITE_OTHER): Payer: BLUE CROSS/BLUE SHIELD | Admitting: Podiatry

## 2016-03-16 ENCOUNTER — Encounter: Payer: Self-pay | Admitting: Podiatry

## 2016-03-16 ENCOUNTER — Ambulatory Visit (INDEPENDENT_AMBULATORY_CARE_PROVIDER_SITE_OTHER): Payer: BLUE CROSS/BLUE SHIELD

## 2016-03-16 VITALS — BP 132/81 | HR 78 | Ht 65.0 in | Wt 175.0 lb

## 2016-03-16 DIAGNOSIS — R52 Pain, unspecified: Secondary | ICD-10-CM

## 2016-03-16 DIAGNOSIS — M722 Plantar fascial fibromatosis: Secondary | ICD-10-CM

## 2016-03-16 DIAGNOSIS — M79671 Pain in right foot: Secondary | ICD-10-CM

## 2016-03-16 DIAGNOSIS — M79673 Pain in unspecified foot: Secondary | ICD-10-CM | POA: Diagnosis not present

## 2016-03-16 MED ORDER — MELOXICAM 15 MG PO TABS: 15.0000 mg | ORAL_TABLET | Freq: Every day | ORAL | 1 refills | Status: AC

## 2016-03-18 MED ORDER — BETAMETHASONE SOD PHOS & ACET 6 (3-3) MG/ML IJ SUSP: 3.0000 mg | Freq: Once | INTRAMUSCULAR | Status: AC

## 2016-03-18 NOTE — Progress Notes (Signed)
Subjective: Patient presents today for pain and tenderness in the right foot. Patient states the foot pain has been hurting for several weeks now. Patient states that it hurts in the mornings with the first steps out of bed. Patient states that she does have orthotics which she received several years ago. The orthotics are designed for dress shoe type with a low profile and minimal padding. Patient presents today for further treatment and evaluation  Objective: Physical Exam General: The patient is alert and oriented x3 in no acute distress.  Dermatology: Skin is warm, dry and supple bilateral lower extremities. Negative for open lesions or macerations bilateral.   Vascular: Dorsalis Pedis and Posterior Tibial pulses palpable bilateral.  Capillary fill time is immediate to all digits.  Neurological: Epicritic and protective threshold intact bilateral.   Musculoskeletal: Tenderness to palpation at the medial calcaneal tubercale and through the insertion of the plantar fascia of the right foot. All other joints range of motion within normal limits bilateral. Strength 5/5 in all groups bilateral.   Radiographic exam: Normal osseous mineralization. Joint spaces preserved. No fracture/dislocation/boney destruction. Calcaneal spur present with mild thickening of plantar fascia right. No other soft tissue abnormalities or radiopaque foreign bodies.   Assessment: 1. Plantar fasciitis right 2. Pain in right foot  Plan of Care:  1. Patient evaluated. Xrays reviewed.   2. Injection of 0.5cc Celestone soluspan injected into the right heel at the insertion of the plantar fascia.  3. Instructed patient regarding therapies and modalities at home to alleviate symptoms.  4. Rx for meloxicam 15 mg dispensed   5. Plantar fascial band(s) dispensed. 6. Today the patient was scanned for custom molded orthotics 7. Return to clinic in 4 weeks for orthotics pickup and follow-up evaluation.

## 2016-03-20 ENCOUNTER — Telehealth: Payer: Self-pay | Admitting: *Deleted

## 2016-03-20 ENCOUNTER — Ambulatory Visit (INDEPENDENT_AMBULATORY_CARE_PROVIDER_SITE_OTHER): Payer: BLUE CROSS/BLUE SHIELD | Admitting: Podiatry

## 2016-03-20 DIAGNOSIS — M2012 Hallux valgus (acquired), left foot: Secondary | ICD-10-CM

## 2016-03-20 DIAGNOSIS — M79672 Pain in left foot: Secondary | ICD-10-CM | POA: Diagnosis not present

## 2016-03-20 DIAGNOSIS — M21612 Bunion of left foot: Secondary | ICD-10-CM

## 2016-03-20 NOTE — Telephone Encounter (Signed)
"  I'd like to schedule a surgery with Dr. Amalia Hailey."  Have you signed consent forms?  "Yes, I just left his office."  Do you have a date in mind that you would like to schedule?  "Yes, I would like to do it the week before Christmas."  He can do it on December 21.  "That would be great!"

## 2016-03-20 NOTE — Progress Notes (Signed)
Subjective:  Patient presents today for follow-up evaluation of plantar fasciitis to the right foot. Patient states she is greatly improved and healed 100%. Patient along experiences pain when walking.  Patient has a new complaint today of a bunion deformity to the left foot. Patient states it is excessively painful. Patient presents today for surgical consult for correction of bunion deformity left foot.    Objective/Physical Exam General: The patient is alert and oriented x3 in no acute distress.  Dermatology: Skin is warm, dry and supple bilateral lower extremities. Negative for open lesions or macerations.  Vascular: Palpable pedal pulses bilaterally. No edema or erythema noted. Capillary refill within normal limits.  Neurological: Epicritic and protective threshold grossly intact bilaterally.   Musculoskeletal Exam: Pain on palpation and range of motion noted to the first MPJ left foot. Clinical evidence of bunion deformity with hallux abductovalgus deformity left foot. Range of motion within normal limits to all pedal and ankle joints bilateral. Muscle strength 5/5 in all groups bilateral.   Radiographic Exam:  Increased intermetatarsal angle greater than 15 with hallux abductus angle greater than 30.  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 #2 today we discussed in detail conservative versus surgical management of bunion correction and alleviation. The patient opts for surgical correction. #3 surgery will consist of bunionectomy with first metatarsal osteotomy left foot. All possible complications and details of the procedure were explained. All patient questions were answered. No guarantees were expressed or implied. #4 return to clinic 1 week postop   Dr. Edrick Kins, [place]

## 2016-03-20 NOTE — Patient Instructions (Signed)

## 2016-04-05 ENCOUNTER — Encounter: Payer: Self-pay | Admitting: Podiatry

## 2016-04-05 DIAGNOSIS — M2012 Hallux valgus (acquired), left foot: Secondary | ICD-10-CM | POA: Diagnosis not present

## 2016-04-05 DIAGNOSIS — M25572 Pain in left ankle and joints of left foot: Secondary | ICD-10-CM | POA: Diagnosis not present

## 2016-04-05 DIAGNOSIS — Z01818 Encounter for other preprocedural examination: Secondary | ICD-10-CM | POA: Diagnosis not present

## 2016-04-07 ENCOUNTER — Telehealth: Payer: Self-pay | Admitting: Sports Medicine

## 2016-04-07 DIAGNOSIS — G8918 Other acute postprocedural pain: Secondary | ICD-10-CM

## 2016-04-07 MED ORDER — HYDROMORPHONE HCL 4 MG PO TABS: 4.0000 mg | ORAL_TABLET | ORAL | 0 refills | Status: DC | PRN

## 2016-04-07 NOTE — Telephone Encounter (Signed)
Patient called stating that she has a fever of 100 and that she is concern because she has constant 7/10 pain that is unrelieved by Oxycodone which is also causing her to itch. I advised patient to take Tylenol for fever; patient states that she can not due to liver condition but has taken Motrin. I encouraged patient to ice and to make sure her environment is cool and to recheck temp every hour to make sure it's not elevating, if >101 go to emergency room. Also advised patient to discontinue Oxycodone and replace with Motrin or other OTC pain meds that she feels safe to take and to take Bendryl for itching. Advised patient to make sure she is icing and elevating her foot and to check the ACE bandage to make sure its not too tight. I encouraged patient to call back if there are any issues. Also I will meet patient in the AM to give her a Rx for Dilaudid to take for severe pain in place of Oxycodone. -Dr. Cannon Kettle

## 2016-04-08 ENCOUNTER — Other Ambulatory Visit (INDEPENDENT_AMBULATORY_CARE_PROVIDER_SITE_OTHER): Payer: BLUE CROSS/BLUE SHIELD | Admitting: Sports Medicine

## 2016-04-08 DIAGNOSIS — Z9889 Other specified postprocedural states: Secondary | ICD-10-CM

## 2016-04-08 DIAGNOSIS — M2012 Hallux valgus (acquired), left foot: Secondary | ICD-10-CM

## 2016-04-08 DIAGNOSIS — G8918 Other acute postprocedural pain: Secondary | ICD-10-CM

## 2016-04-08 MED ORDER — CEPHALEXIN 500 MG PO CAPS: 500.0000 mg | ORAL_CAPSULE | Freq: Three times a day (TID) | ORAL | 0 refills | Status: DC

## 2016-04-08 MED ORDER — HYDROMORPHONE HCL 4 MG PO TABS: 4.0000 mg | ORAL_TABLET | ORAL | 0 refills | Status: DC | PRN

## 2016-04-08 NOTE — Progress Notes (Signed)
04/08/16   Subjective: Jessica Spencer 50 y.o. female patient was met this AM in [place] office for change of pain medication and post op site check. Patient reports this AM continued pain 7/10 and resolution of fever. Patient states that as she was taking a shower this AM got the dressing wet. Patient denies any other pedal complaints.  Patient Active Problem List   Diagnosis Date Noted  . 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 Neck pain, chronic 04/08/2015  . Atrophic vaginitis 01/07/2015  . 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 Routine general medical examination at a health care facility 01/12/2013  . Generalized anxiety disorder 10/03/2012  . Bunion of left foot 07/06/2011  . NASH (nonalcoholic steatohepatitis) 02/14/2011  . Depression with anxiety 02/14/2011  . IBS 11/12/2006  . HEMATURIA, CHRONIC 11/12/2006  . INSOMNIA 11/12/2006    Objective: NAD, A+O x 3 Temp- 98.6 F BP 118/66 pulse 76  Focused Left foot exam: Outer dressing clean, damp but intact. Upon removal of dressings, surgical site/ incision intact with no active drainage, no erythema, no malodor, no warmth, no acute signs of infection, A small friction blister at anterior ankle likely from dressings rubbing with no serous drainage, Mild focal post op ecchymosis to surgical site and edema to left forefoot, gross sensation present via light touch, no pain with palpation to other areas of foot or calf.   Assessment and Plan: S/p Left bunionectomy,04-05-16 -Patient seen and evaluated  -Sterile Dressing change performed; Advised patient to keep dressing to the foot clean, dry, and intact; Encourage patient to sponge bathe or to purchase a shower protector -A new walking CAM boot was dispensed at today's visit since patient's boot was WET -Rx Dilaudid to take in place of Oxycodone; May continue with motrin for any pain that's unrelieved in between doses of Dilaudid -Rx Keflex for preventive measures and advised patient  to apply topical antibiotic cream to blister at anterior ankle and cover with stockinet  -Encouraged rest, ice, and elevation -Patient did not have a fever this AM and there were no localized signs of infection; advised patient to monitor if recurs >101 degrees F to go to ER -Patient to return as scheduled for continued post op care with Dr. Amalia Hailey  Landis Martins, DPM

## 2016-04-12 ENCOUNTER — Ambulatory Visit (INDEPENDENT_AMBULATORY_CARE_PROVIDER_SITE_OTHER): Payer: BLUE CROSS/BLUE SHIELD | Admitting: Podiatry

## 2016-04-12 ENCOUNTER — Encounter: Payer: Self-pay | Admitting: Podiatry

## 2016-04-12 ENCOUNTER — Ambulatory Visit (INDEPENDENT_AMBULATORY_CARE_PROVIDER_SITE_OTHER): Payer: BLUE CROSS/BLUE SHIELD

## 2016-04-12 DIAGNOSIS — M79672 Pain in left foot: Secondary | ICD-10-CM | POA: Diagnosis not present

## 2016-04-12 DIAGNOSIS — Z9889 Other specified postprocedural states: Secondary | ICD-10-CM

## 2016-04-13 ENCOUNTER — Ambulatory Visit: Payer: BLUE CROSS/BLUE SHIELD | Admitting: Podiatry

## 2016-04-14 NOTE — Progress Notes (Signed)
Subjective: Patient presents today status post one week for corrective bunion surgery to the left foot. Date of surgery 04/05/2016. Patient states that she's doing well. Patient states the pain is under control.  Objective: Incision sites well coapted with sutures intact. Moderate edema noted. No sign of infectious process.  Assessment: Evidence post-bunionectomy to the left foot. Date of surgery 04/05/2016.  Plan of care: Today the patient was evaluated. Dressings were changed. Return to clinic in 1 week.

## 2016-04-19 ENCOUNTER — Ambulatory Visit (INDEPENDENT_AMBULATORY_CARE_PROVIDER_SITE_OTHER): Payer: Self-pay | Admitting: Podiatry

## 2016-04-19 DIAGNOSIS — Z9889 Other specified postprocedural states: Secondary | ICD-10-CM

## 2016-04-19 MED ORDER — DOXYCYCLINE HYCLATE 100 MG PO TABS: 100.0000 mg | ORAL_TABLET | Freq: Two times a day (BID) | ORAL | 0 refills | Status: DC

## 2016-04-22 NOTE — Progress Notes (Signed)
Subjective: Patient presents today status post [place] bunionectomy to the left foot. Date of surgery 04/05/2016. Patient states that she feels slight numbness and pain.  Objective: Incision sites are well coapted sutures intact. Moderate edema noted. There is a small amount of cellulitis noted localized to the left foot incision sites. Negative for malodor or drainage.  Assessment: Status post [place] bunionectomy left foot. Date of surgery 04/05/2016. Mild cellulitis left foot.  Plan of care: Today dressings were changed. Patient may begin wearing a postoperative shoe. Prescription for doxycycline 100 mg #20. Return to clinic in 1 week

## 2016-04-27 ENCOUNTER — Ambulatory Visit (INDEPENDENT_AMBULATORY_CARE_PROVIDER_SITE_OTHER): Payer: BLUE CROSS/BLUE SHIELD

## 2016-04-27 ENCOUNTER — Ambulatory Visit (INDEPENDENT_AMBULATORY_CARE_PROVIDER_SITE_OTHER): Payer: BLUE CROSS/BLUE SHIELD | Admitting: Podiatry

## 2016-04-27 DIAGNOSIS — R600 Localized edema: Secondary | ICD-10-CM

## 2016-04-27 DIAGNOSIS — Z9889 Other specified postprocedural states: Secondary | ICD-10-CM | POA: Diagnosis not present

## 2016-04-27 MED ORDER — GENTAMICIN SULFATE 0.1 % EX CREA: 1.0000 "application " | TOPICAL_CREAM | Freq: Two times a day (BID) | CUTANEOUS | 1 refills | Status: DC

## 2016-05-02 NOTE — Progress Notes (Signed)
Subjective: Patient presents today status post [place] bunionectomy to the left foot. Date of surgery 04/05/2016. Patient states that she's feeling much better.   Objective: Incision sites are well coapted sutures intact. No sign of infectious process. Cellulitis from last visit appears to be improved.  Assessment: Status post [place] bunionectomy left foot. Date of surgery 04/05/2016.   Plan of care: Today dressings were changed. Patient can begin showering. Prescription for gentamicin cream prescribed. Compression anklet dispensed. Return to clinic in 2 weeks

## 2016-05-04 ENCOUNTER — Telehealth: Payer: Self-pay | Admitting: Podiatry

## 2016-05-04 MED ORDER — DOXYCYCLINE HYCLATE 100 MG PO TABS: 100.0000 mg | ORAL_TABLET | Freq: Two times a day (BID) | ORAL | 0 refills | Status: DC

## 2016-05-04 NOTE — Addendum Note (Signed)
Addended by: Harriett Sine D on: 05/04/2016 01:24 PM   Modules accepted: Orders

## 2016-05-04 NOTE — Telephone Encounter (Signed)
Patient has a PO appt on 05/11/16 with Dr. Amalia Hailey, but she is going to run out of her antibotic doxicycline tomorrow. Patient states she doesn't think her foot is any better than it was at her last visit. Wants to know if Dr. Amalia Hailey wants to refill her med's before running out. Please call her to discuss.

## 2016-05-04 NOTE — Telephone Encounter (Signed)
I informed pt that Dr. Amalia Hailey was going to refill the Doxycycline and if she did not see a lot of improvement before her schedule appt or worsened she should give [place] a call. Pt states there is not any black to the area, but had drainage last night.

## 2016-05-06 DIAGNOSIS — N281 Cyst of kidney, acquired: Secondary | ICD-10-CM | POA: Diagnosis not present

## 2016-05-06 DIAGNOSIS — K7581 Nonalcoholic steatohepatitis (NASH): Secondary | ICD-10-CM | POA: Diagnosis not present

## 2016-05-06 DIAGNOSIS — C220 Liver cell carcinoma: Secondary | ICD-10-CM | POA: Diagnosis not present

## 2016-05-09 DIAGNOSIS — E785 Hyperlipidemia, unspecified: Secondary | ICD-10-CM | POA: Diagnosis not present

## 2016-05-09 DIAGNOSIS — E559 Vitamin D deficiency, unspecified: Secondary | ICD-10-CM | POA: Diagnosis not present

## 2016-05-09 DIAGNOSIS — K7581 Nonalcoholic steatohepatitis (NASH): Secondary | ICD-10-CM | POA: Diagnosis not present

## 2016-05-11 ENCOUNTER — Ambulatory Visit (INDEPENDENT_AMBULATORY_CARE_PROVIDER_SITE_OTHER): Payer: BLUE CROSS/BLUE SHIELD

## 2016-05-11 ENCOUNTER — Ambulatory Visit (INDEPENDENT_AMBULATORY_CARE_PROVIDER_SITE_OTHER): Payer: BLUE CROSS/BLUE SHIELD | Admitting: Podiatry

## 2016-05-11 ENCOUNTER — Telehealth: Payer: Self-pay | Admitting: *Deleted

## 2016-05-11 VITALS — Temp 97.8°F

## 2016-05-11 DIAGNOSIS — M21612 Bunion of left foot: Secondary | ICD-10-CM

## 2016-05-11 DIAGNOSIS — M722 Plantar fascial fibromatosis: Secondary | ICD-10-CM

## 2016-05-11 DIAGNOSIS — M79671 Pain in right foot: Secondary | ICD-10-CM

## 2016-05-11 DIAGNOSIS — M2012 Hallux valgus (acquired), left foot: Secondary | ICD-10-CM

## 2016-05-11 DIAGNOSIS — Z9889 Other specified postprocedural states: Secondary | ICD-10-CM

## 2016-05-11 NOTE — Telephone Encounter (Addendum)
-----   Message from Edrick Kins, DPM sent at 05/11/2016 10:22 AM EST ----- Regarding: Physical Therapy Physical therapy 3x/week x 4 weeks.  - Increase range of motion left 1st MPJ  Dx : s/p bunionectomy left foot. DOS: 04/05/17.   Dr. Amalia Hailey. Orders to [facility] on [address] [place], [place].

## 2016-05-14 DIAGNOSIS — L820 Inflamed seborrheic keratosis: Secondary | ICD-10-CM | POA: Diagnosis not present

## 2016-05-14 DIAGNOSIS — L718 Other rosacea: Secondary | ICD-10-CM | POA: Diagnosis not present

## 2016-05-14 DIAGNOSIS — D229 Melanocytic nevi, unspecified: Secondary | ICD-10-CM | POA: Diagnosis not present

## 2016-05-14 DIAGNOSIS — Z1283 Encounter for screening for malignant neoplasm of skin: Secondary | ICD-10-CM | POA: Diagnosis not present

## 2016-05-14 DIAGNOSIS — D485 Neoplasm of uncertain behavior of skin: Secondary | ICD-10-CM | POA: Diagnosis not present

## 2016-05-18 DIAGNOSIS — M21612 Bunion of left foot: Secondary | ICD-10-CM | POA: Diagnosis not present

## 2016-05-18 DIAGNOSIS — M25572 Pain in left ankle and joints of left foot: Secondary | ICD-10-CM | POA: Diagnosis not present

## 2016-05-20 MED ORDER — BETAMETHASONE SOD PHOS & ACET 6 (3-3) MG/ML IJ SUSP: 3.0000 mg | Freq: Once | INTRAMUSCULAR | Status: AC

## 2016-05-20 NOTE — Progress Notes (Signed)
Subjective: Patient presents today status post [place] bunionectomy to the left foot. Date of surgery 04/05/2016. Patient states that she's feeling much better. Patient also has a complaint of pain and tenderness to the right heel. Patient states she has noticed significant pain in the mornings when she gets out of bed and after sitting for long periods of time.   Objective: Incision sites are well coapted sutures intact. No sign of infectious process. Cellulitis from last visit appears to be improved. Musculoskeletal exam: Pain on palpation to the medial calcaneal tubercle at the insertion of the plantar fascia right foot.  Assessment: #1 Status post [place] bunionectomy left foot. Date of surgery 04/05/2016.  #2 plantar fasciitis right foot  Plan of care: #1 patient was evaluated #2 orders for physical therapy to the left foot #3 compression anklet dispensed left foot next line #4 injection of 0.5 mL Celestone Soluspan injected in the patient's right plantar fascia the insertion of the medial calcaneal tubercle #4 plantar fascial brace dispensed #5 return to clinic in 4 weeks

## 2016-05-21 DIAGNOSIS — M25572 Pain in left ankle and joints of left foot: Secondary | ICD-10-CM | POA: Diagnosis not present

## 2016-05-21 DIAGNOSIS — M21612 Bunion of left foot: Secondary | ICD-10-CM | POA: Diagnosis not present

## 2016-05-22 NOTE — Progress Notes (Signed)
DOS 12.21.2017 Bunionectomy left foot.

## 2016-05-25 DIAGNOSIS — M25572 Pain in left ankle and joints of left foot: Secondary | ICD-10-CM | POA: Diagnosis not present

## 2016-05-25 DIAGNOSIS — M21612 Bunion of left foot: Secondary | ICD-10-CM | POA: Diagnosis not present

## 2016-05-29 DIAGNOSIS — M21612 Bunion of left foot: Secondary | ICD-10-CM | POA: Diagnosis not present

## 2016-05-29 DIAGNOSIS — M25572 Pain in left ankle and joints of left foot: Secondary | ICD-10-CM | POA: Diagnosis not present

## 2016-06-01 DIAGNOSIS — M21612 Bunion of left foot: Secondary | ICD-10-CM | POA: Diagnosis not present

## 2016-06-01 DIAGNOSIS — M25572 Pain in left ankle and joints of left foot: Secondary | ICD-10-CM | POA: Diagnosis not present

## 2016-06-05 DIAGNOSIS — M21612 Bunion of left foot: Secondary | ICD-10-CM | POA: Diagnosis not present

## 2016-06-05 DIAGNOSIS — M25572 Pain in left ankle and joints of left foot: Secondary | ICD-10-CM | POA: Diagnosis not present

## 2016-06-08 ENCOUNTER — Ambulatory Visit (INDEPENDENT_AMBULATORY_CARE_PROVIDER_SITE_OTHER): Payer: BLUE CROSS/BLUE SHIELD

## 2016-06-08 ENCOUNTER — Ambulatory Visit (INDEPENDENT_AMBULATORY_CARE_PROVIDER_SITE_OTHER): Payer: BLUE CROSS/BLUE SHIELD | Admitting: Podiatry

## 2016-06-08 DIAGNOSIS — M25572 Pain in left ankle and joints of left foot: Secondary | ICD-10-CM | POA: Diagnosis not present

## 2016-06-08 DIAGNOSIS — M21612 Bunion of left foot: Secondary | ICD-10-CM

## 2016-06-08 DIAGNOSIS — Z9889 Other specified postprocedural states: Secondary | ICD-10-CM

## 2016-06-08 DIAGNOSIS — M79671 Pain in right foot: Secondary | ICD-10-CM

## 2016-06-08 DIAGNOSIS — M2012 Hallux valgus (acquired), left foot: Secondary | ICD-10-CM | POA: Diagnosis not present

## 2016-06-08 DIAGNOSIS — M722 Plantar fascial fibromatosis: Secondary | ICD-10-CM | POA: Diagnosis not present

## 2016-06-12 MED ORDER — BETAMETHASONE SOD PHOS & ACET 6 (3-3) MG/ML IJ SUSP: 3.0000 mg | Freq: Once | INTRAMUSCULAR | Status: AC

## 2016-06-12 NOTE — Progress Notes (Signed)
Subjective: Patient presents today status post [place] bunionectomy to the left foot. Date of surgery 04/05/2016. Patient states that her foot is doing well however she does have some stiffness in her joint of the left foot. Patient also complains of right heel pain. She states that the last injection did not help however she notices minimal improvement.  Objective: Incision sites to the surgical sites are well coapted. No sign of infectious process. Minimal edema noted. Musculoskeletal exam: Pain on palpation to the medial calcaneal tubercle at the insertion of the plantar fascia right foot.  Assessment: #1 status post bunionectomy left foot. Date of surgery 04/05/2016. #2 plantar fasciitis right foot next  Plan of care: #1 the patient was evaluated #2 the patient can discontinue physical therapy #3 continue compression anklet #4 injection of 0.5 mL Celestone Soluspan injected in the patient's right plantar fascia #5 continue plantar fascial brace #6 return to clinic in 3 months, if the patient is not notice improvement and decides to come in early we will do an injection of the first metatarsal phalangeal joint with joint manipulation.

## 2016-06-13 DIAGNOSIS — M25572 Pain in left ankle and joints of left foot: Secondary | ICD-10-CM | POA: Diagnosis not present

## 2016-06-13 DIAGNOSIS — M21612 Bunion of left foot: Secondary | ICD-10-CM | POA: Diagnosis not present

## 2016-06-15 DIAGNOSIS — M25572 Pain in left ankle and joints of left foot: Secondary | ICD-10-CM | POA: Diagnosis not present

## 2016-06-15 DIAGNOSIS — M21612 Bunion of left foot: Secondary | ICD-10-CM | POA: Diagnosis not present

## 2016-06-18 DIAGNOSIS — M21612 Bunion of left foot: Secondary | ICD-10-CM | POA: Diagnosis not present

## 2016-06-18 DIAGNOSIS — M25572 Pain in left ankle and joints of left foot: Secondary | ICD-10-CM | POA: Diagnosis not present

## 2016-07-06 ENCOUNTER — Other Ambulatory Visit: Payer: Self-pay | Admitting: Family

## 2016-07-27 DIAGNOSIS — M21612 Bunion of left foot: Secondary | ICD-10-CM | POA: Diagnosis not present

## 2016-07-27 DIAGNOSIS — M25572 Pain in left ankle and joints of left foot: Secondary | ICD-10-CM | POA: Diagnosis not present

## 2016-08-10 DIAGNOSIS — M25572 Pain in left ankle and joints of left foot: Secondary | ICD-10-CM | POA: Diagnosis not present

## 2016-08-10 DIAGNOSIS — M21612 Bunion of left foot: Secondary | ICD-10-CM | POA: Diagnosis not present

## 2016-08-14 DIAGNOSIS — M21612 Bunion of left foot: Secondary | ICD-10-CM | POA: Diagnosis not present

## 2016-08-14 DIAGNOSIS — M25572 Pain in left ankle and joints of left foot: Secondary | ICD-10-CM | POA: Diagnosis not present

## 2016-08-17 DIAGNOSIS — M25572 Pain in left ankle and joints of left foot: Secondary | ICD-10-CM | POA: Diagnosis not present

## 2016-08-17 DIAGNOSIS — M21612 Bunion of left foot: Secondary | ICD-10-CM | POA: Diagnosis not present

## 2016-08-31 ENCOUNTER — Ambulatory Visit (INDEPENDENT_AMBULATORY_CARE_PROVIDER_SITE_OTHER): Payer: BLUE CROSS/BLUE SHIELD | Admitting: Podiatry

## 2016-08-31 DIAGNOSIS — M722 Plantar fascial fibromatosis: Secondary | ICD-10-CM

## 2016-08-31 DIAGNOSIS — M7752 Other enthesopathy of left foot: Secondary | ICD-10-CM

## 2016-08-31 MED ORDER — DICLOFENAC SODIUM 75 MG PO TBEC: 75.0000 mg | DELAYED_RELEASE_TABLET | Freq: Two times a day (BID) | ORAL | 1 refills | Status: DC

## 2016-08-31 MED ORDER — METHYLPREDNISOLONE 4 MG PO TABS: ORAL_TABLET | ORAL | 0 refills | Status: DC

## 2016-09-01 NOTE — Progress Notes (Signed)
   Subjective: Patient presents today for aching pain and tenderness in bilateral feet. She states the pain is located in her right heel and left great toe. She reports associated swelling of the left great toe. Patient states the foot pain has been hurting for several weeks now. She has been icing the areas with some improvement of the pain. Patient presents today for further treatment and evaluation  Objective: Physical Exam General: The patient is alert and oriented x3 in no acute distress.  Dermatology: Skin is warm, dry and supple bilateral lower extremities. Negative for open lesions or macerations bilateral.   Vascular: Dorsalis Pedis and Posterior Tibial pulses palpable bilateral.  Capillary fill time is immediate to all digits.  Neurological: Epicritic and protective threshold intact bilateral.   Musculoskeletal: Tenderness to palpation at the medial calcaneal tubercale and through the insertion of the plantar fascia of the right foot. Tenderness to palpation of the 1st MPJ of the left foot. All other joints range of motion within normal limits bilateral. Strength 5/5 in all groups bilateral.    Assessment: 1. Plantar fasciitis right 2. Pain in right foot 3. 1st MPJ capsulitis left  Plan of Care:  1. Patient evaluated.  2. Injection of 0.5cc Celestone soluspan injected into the right heel at the insertion of the plantar fascia.  3. Injection of 0.5 mLs Celestone Soluspan injected into the 1st MPJ of the left foot. 4. Instructed patient regarding therapies and modalities at home to alleviate symptoms.  5. Rx for Diclofenac 75 mg dispensed. 6. Rx for Medrol Dose Pak dispensed. 7. Return to clinic in 4 weeks.   Edrick Kins, DPM [facility]  Dr. Edrick Kins, [place]                                        [place], [place] [address]                Office [phone]  Fax [phone]

## 2016-09-04 MED ORDER — BETAMETHASONE SOD PHOS & ACET 6 (3-3) MG/ML IJ SUSP: 3.0000 mg | Freq: Once | INTRAMUSCULAR | Status: AC

## 2016-09-17 DIAGNOSIS — J069 Acute upper respiratory infection, unspecified: Secondary | ICD-10-CM | POA: Diagnosis not present

## 2016-09-25 ENCOUNTER — Encounter: Payer: Self-pay | Admitting: Family

## 2016-09-25 ENCOUNTER — Ambulatory Visit (INDEPENDENT_AMBULATORY_CARE_PROVIDER_SITE_OTHER): Payer: BLUE CROSS/BLUE SHIELD | Admitting: Family

## 2016-09-25 VITALS — BP 122/84 | HR 65 | Temp 98.4°F | Ht 65.0 in | Wt 182.2 lb

## 2016-09-25 DIAGNOSIS — J40 Bronchitis, not specified as acute or chronic: Secondary | ICD-10-CM

## 2016-09-25 MED ORDER — HYDROCODONE-HOMATROPINE 5-1.5 MG/5ML PO SYRP: 5.0000 mL | ORAL_SOLUTION | Freq: Every evening | ORAL | 0 refills | Status: DC | PRN

## 2016-09-25 NOTE — Progress Notes (Signed)
Pre visit review using our clinic review tool, if applicable. No additional management support is needed unless otherwise documented below in the visit note. 

## 2016-09-25 NOTE — Progress Notes (Signed)
Subjective:    Patient ID: Jessica Spencer, female    DOB: 11/13/1965, 51 y.o.   MRN: [id number]  CC: Jessica Spencer is a 51 y.o. female who presents today for an acute visit.    HPI: CC: dry cough, HA, better 'for sure' now, 10 days, improved.   Ear pain and sore throat little better. No congestion. 2 days ago had tmax 18F, resolved since then.   "not worst HA of life.' Triggered by coughing especially at night. No vision changes. Describes as ache across entire back of head. 1q  Had been to urgent care last week and given tessalon, ibuprofen 836m , flonase. Flu and strep negative per patient. Ibuprofen is helping HA.   No lung disease   Remote h/o migraine.   No abdominal pain, N, V, dysuria.        HISTORY:  Past Medical History:  Diagnosis Date  . Anxiety   . Bell's palsy   . Constipation    uses miralax PRN only   . Depression   . Hyperlipidemia   . Hypertension   . NASH (nonalcoholic steatohepatitis)    Followed by Dr. DGerald Dexter  Past Surgical History:  Procedure Laterality Date  . ABDOMINAL HYSTERECTOMY    . ABDOMINAL SURGERY     x3  . APPENDECTOMY    . CARPAL TUNNEL RELEASE Right   . carpal tunnel repair     Onset date 03/2008  . COLONOSCOPY  2004, 2016  . ESOPHAGOGASTRODUODENOSCOPY  2004  . small intestine obstruction    . THYROGLOSSAL DUCT CYST  03/2014   [facility]  Family History  Problem Relation Age of Onset  . Cancer Father   . Breast cancer Mother 620 . Colon polyps Mother   . Diverticulosis Mother   . Colon cancer Neg Hx   . Esophageal cancer Neg Hx   . Rectal cancer Neg Hx   . Stomach cancer Neg Hx     Allergies: Aurothioglucose; Fentanyl; Formaldehyde; Gold-containing drug products; Morphine; Nickel; Sulfamethoxazole-trimethoprim; and Tape Current Outpatient Prescriptions on File Prior to Visit  Medication Sig Dispense Refill  . Cholecalciferol (VITAMIN D) 2000 units CAPS Take by mouth.    . escitalopram  (LEXAPRO) 5 MG tablet TAKE 1 TABLET (5 MG TOTAL) BY MOUTH DAILY. 90 tablet 3  . fluticasone (FLONASE) 50 MCG/ACT nasal spray Place into the nose.    . pravastatin (PRAVACHOL) 10 MG tablet Take 1 tablet (10 mg total) by mouth daily. 30 tablet 5  . vitamin E 800 UNIT capsule Take 800 Units by mouth daily.       Current Facility-Administered Medications on File Prior to Visit  Medication Dose Route Frequency Provider Last Rate Last Dose  . betamethasone acetate-betamethasone sodium phosphate (CELESTONE) injection 3 mg  3 mg Intramuscular Once EDaylene KatayamaM, DPM      . betamethasone acetate-betamethasone sodium phosphate (CELESTONE) injection 3 mg  3 mg Intramuscular Once EDaylene KatayamaM, DPM      . betamethasone acetate-betamethasone sodium phosphate (CELESTONE) injection 3 mg  3 mg Intramuscular Once EDaylene KatayamaM, DPM      . betamethasone acetate-betamethasone sodium phosphate (CELESTONE) injection 3 mg  3 mg Intramuscular Once EEdrick Kins DPM        Social History  Substance Use Topics  . Smoking status: Never Smoker  . Smokeless tobacco: Never Used  . Alcohol use No    Review of Systems  Constitutional: Negative for chills and  fever.  Respiratory: Positive for cough.   Cardiovascular: Negative for chest pain and palpitations.  Gastrointestinal: Negative for nausea and vomiting.      Objective:    BP 122/84   Pulse 65   Temp 98.4 F (36.9 C) (Oral)   Ht 5' 5"  (1.651 m)   Wt 182 lb 3.2 oz (82.6 kg)   SpO2 97%   BMI 30.32 kg/m    Physical Exam  Constitutional: She appears well-developed and well-nourished.  HENT:  Head: Normocephalic and atraumatic.  Right Ear: Hearing, tympanic membrane, external ear and ear canal normal. No drainage, swelling or tenderness. No foreign bodies. Tympanic membrane is not erythematous and not bulging. No middle ear effusion. No decreased hearing is noted.  Left Ear: Hearing, tympanic membrane, external ear and ear canal normal. No  drainage, swelling or tenderness. No foreign bodies. Tympanic membrane is not erythematous and not bulging.  No middle ear effusion. No decreased hearing is noted.  Nose: Nose normal. No rhinorrhea. Right sinus exhibits no maxillary sinus tenderness and no frontal sinus tenderness. Left sinus exhibits no maxillary sinus tenderness and no frontal sinus tenderness.  Mouth/Throat: Uvula is midline, oropharynx is clear and moist and mucous membranes are normal. No oropharyngeal exudate, posterior oropharyngeal edema, posterior oropharyngeal erythema or tonsillar abscesses.  Eyes: Conjunctivae are normal.  Cardiovascular: Regular rhythm, normal heart sounds and normal pulses.   Pulmonary/Chest: Effort normal and breath sounds normal. She has no wheezes. She has no rhonchi. She has no rales.  Lymphadenopathy:       Head (right side): No submental, no submandibular, no tonsillar, no preauricular, no posterior auricular and no occipital adenopathy present.       Head (left side): No submental, no submandibular, no tonsillar, no preauricular, no posterior auricular and no occipital adenopathy present.    She has no cervical adenopathy.  Neurological: She is alert.  Skin: Skin is warm and dry.  Psychiatric: She has a normal mood and affect. Her speech is normal and behavior is normal. Thought content normal.  Vitals reviewed.      Assessment & Plan:   1. Bronchitis Afebrile. Improved. Patient is in no acute respiratory distress, well-appearing. We jointly decided that antibiotics therapy necessary as she is improved so much. Suspect headache triggered from coughing. Agreed to treat with Hycodan cough syrup at bedtime. Return precautions given  - HYDROcodone-homatropine (HYCODAN) 5-1.5 MG/5ML syrup; Take 5 mLs by mouth at bedtime as needed for cough.  Dispense: 30 mL; Refill: 0    I have discontinued Ms. Raetz's Polyethylene Glycol 3350 (MIRALAX PO), Estradiol, HYDROmorphone, doxycycline,  oxyCODONE-acetaminophen, meloxicam, methylPREDNISolone, and diclofenac. I am also having her start on HYDROcodone-homatropine. Additionally, I am having her maintain her vitamin E, fluticasone, pravastatin, Vitamin D, and escitalopram. We will continue to administer betamethasone acetate-betamethasone sodium phosphate, betamethasone acetate-betamethasone sodium phosphate, betamethasone acetate-betamethasone sodium phosphate, and betamethasone acetate-betamethasone sodium phosphate.   Meds ordered this encounter  Medications  . HYDROcodone-homatropine (HYCODAN) 5-1.5 MG/5ML syrup    Sig: Take 5 mLs by mouth at bedtime as needed for cough.    Dispense:  30 mL    Refill:  0    Order Specific Question:   Supervising Provider    Answer:   Crecencio Mc [[id number]]    Return precautions given.   Risks, benefits, and alternatives of the medications and treatment plan prescribed today were discussed, and patient expressed understanding.   Education regarding symptom management and diagnosis given to patient on AVS.  Continue  to follow with Burnard Hawthorne, FNP for routine health maintenance.   Jessica Spencer and I agreed with plan.   Mable Paris, FNP

## 2016-09-25 NOTE — Patient Instructions (Signed)
Suspect headaches triggered by cough as discussed   Glad You are feeling better  Please take cough medication at night only as needed. As we discussed, I do not recommend dosing throughout the day as coughing is a protective mechanism . It also helps to break up thick mucous.  Do not take cough suppressants with alcohol as can lead to trouble breathing. Advise caution if taking cough suppressant and operating machinery ( i.e driving a car) as you may feel very tired.   Let me know if that starts to change

## 2016-09-28 ENCOUNTER — Encounter: Payer: Self-pay | Admitting: Family

## 2016-09-28 MED ORDER — AZITHROMYCIN 250 MG PO TABS: ORAL_TABLET | ORAL | 0 refills | Status: DC

## 2016-09-28 NOTE — Telephone Encounter (Signed)
Medication has been sent in per providers order.

## 2016-10-05 ENCOUNTER — Ambulatory Visit (INDEPENDENT_AMBULATORY_CARE_PROVIDER_SITE_OTHER): Payer: BLUE CROSS/BLUE SHIELD | Admitting: Podiatry

## 2016-10-05 ENCOUNTER — Ambulatory Visit (INDEPENDENT_AMBULATORY_CARE_PROVIDER_SITE_OTHER): Payer: BLUE CROSS/BLUE SHIELD

## 2016-10-05 DIAGNOSIS — M84375A Stress fracture, left foot, initial encounter for fracture: Secondary | ICD-10-CM | POA: Diagnosis not present

## 2016-10-05 DIAGNOSIS — M7752 Other enthesopathy of left foot: Secondary | ICD-10-CM

## 2016-10-09 NOTE — Progress Notes (Signed)
   Subjective:  Patient presents today status post bunionectomy on the left foot. DOS: 04/05/16. She states her condition has significantly improved and she is doing great. She she reports some continued pain in the left foot.     Objective/Physical Exam Skin incisions appear to be well coapted. No sign of infectious process noted. No dehiscence. No active bleeding noted.   Radiographic Exam:  Subtle transverse stress fracture to the medial aspect of the second metatarsal with cortical thickening.  Assessment: 1. s/p bunionectomy left foot. DOS: 04/05/16.   Plan of Care:  1. Patient was evaluated. X-rays reviewed 2. Recommended decreasing activity. Low impact activity. 3. Recommended good shoe gear. 4. Return to clinic 6 weeks.   Edrick Kins, DPM [facility]  Dr. Edrick Kins, [place]                                        [place], [place] [address]                Office [phone]  Fax [phone]

## 2016-10-16 DIAGNOSIS — R109 Unspecified abdominal pain: Secondary | ICD-10-CM | POA: Diagnosis not present

## 2016-10-16 DIAGNOSIS — R3129 Other microscopic hematuria: Secondary | ICD-10-CM | POA: Diagnosis not present

## 2016-10-16 DIAGNOSIS — N393 Stress incontinence (female) (male): Secondary | ICD-10-CM | POA: Diagnosis not present

## 2016-10-16 DIAGNOSIS — N200 Calculus of kidney: Secondary | ICD-10-CM | POA: Diagnosis not present

## 2016-10-22 DIAGNOSIS — H52223 Regular astigmatism, bilateral: Secondary | ICD-10-CM | POA: Diagnosis not present

## 2016-11-07 DIAGNOSIS — E559 Vitamin D deficiency, unspecified: Secondary | ICD-10-CM | POA: Diagnosis not present

## 2016-11-07 DIAGNOSIS — K740 Hepatic fibrosis: Secondary | ICD-10-CM | POA: Diagnosis not present

## 2016-11-07 DIAGNOSIS — K7581 Nonalcoholic steatohepatitis (NASH): Secondary | ICD-10-CM | POA: Diagnosis not present

## 2016-11-07 DIAGNOSIS — E785 Hyperlipidemia, unspecified: Secondary | ICD-10-CM | POA: Diagnosis not present

## 2016-12-21 ENCOUNTER — Ambulatory Visit (INDEPENDENT_AMBULATORY_CARE_PROVIDER_SITE_OTHER): Payer: BLUE CROSS/BLUE SHIELD | Admitting: Podiatry

## 2016-12-21 DIAGNOSIS — M722 Plantar fascial fibromatosis: Secondary | ICD-10-CM

## 2016-12-24 NOTE — Progress Notes (Signed)
   Subjective: Patient presents today for follow up evaluation of aching pain and tenderness in the right foot. She states the pain has improved but is still present. She is requesting an injection at this visit. She states the last injection she received helped alleviate the pain for about two weeks. Patient presents today for further treatment and evaluation  Objective: Physical Exam General: The patient is alert and oriented x3 in no acute distress.  Dermatology: Skin is warm, dry and supple bilateral lower extremities. Negative for open lesions or macerations bilateral.   Vascular: Dorsalis Pedis and Posterior Tibial pulses palpable bilateral.  Capillary fill time is immediate to all digits.  Neurological: Epicritic and protective threshold intact bilateral.   Musculoskeletal: Tenderness to palpation at the medial calcaneal tubercale and through the insertion of the plantar fascia of the right foot. All other joints range of motion within normal limits bilateral. Strength 5/5 in all groups bilateral.    Assessment: 1. Plantar fasciitis right 2. Pain in right foot  Plan of Care:  1. Patient evaluated.  2. Injection of 0.5cc Celestone soluspan injected into the right heel at the insertion of the plantar fascia.  3. Continue wearing plantar fascial brace and good shoe gear. 4. Return to clinic when necessary.   Edrick Kins, DPM [facility]  Dr. Edrick Kins, [place]                                        [place], [place] [address]                Office [phone]  Fax [phone]

## 2016-12-27 MED ORDER — BETAMETHASONE SOD PHOS & ACET 6 (3-3) MG/ML IJ SUSP: 3.0000 mg | Freq: Once | INTRAMUSCULAR | Status: AC

## 2017-01-24 ENCOUNTER — Other Ambulatory Visit: Payer: Self-pay | Admitting: Family

## 2017-01-24 DIAGNOSIS — Z1231 Encounter for screening mammogram for malignant neoplasm of breast: Secondary | ICD-10-CM

## 2017-02-05 DIAGNOSIS — R079 Chest pain, unspecified: Secondary | ICD-10-CM | POA: Diagnosis not present

## 2017-02-05 DIAGNOSIS — Z6830 Body mass index (BMI) 30.0-30.9, adult: Secondary | ICD-10-CM | POA: Diagnosis not present

## 2017-02-07 DIAGNOSIS — Z23 Encounter for immunization: Secondary | ICD-10-CM | POA: Diagnosis not present

## 2017-02-10 DIAGNOSIS — R0781 Pleurodynia: Secondary | ICD-10-CM | POA: Diagnosis not present

## 2017-03-01 ENCOUNTER — Ambulatory Visit
Admission: RE | Admit: 2017-03-01 | Discharge: 2017-03-01 | Disposition: A | Discharge status: Home / Self Care | Payer: BLUE CROSS/BLUE SHIELD | Source: Ambulatory Visit | Attending: Family | Admitting: Family

## 2017-03-01 DIAGNOSIS — Z1231 Encounter for screening mammogram for malignant neoplasm of breast: Secondary | ICD-10-CM

## 2017-04-12 ENCOUNTER — Ambulatory Visit: Payer: BLUE CROSS/BLUE SHIELD | Admitting: Podiatry

## 2017-04-12 ENCOUNTER — Encounter: Payer: Self-pay | Admitting: Podiatry

## 2017-04-12 DIAGNOSIS — M722 Plantar fascial fibromatosis: Secondary | ICD-10-CM | POA: Diagnosis not present

## 2017-04-18 NOTE — Progress Notes (Signed)
   Subjective: Patient presents today for follow up evaluation of right plantar fasciitis.  She states the pain returned approximately 1 month ago.  She states it is worse at the end of the day.  She has been wearing the fascial brace and orthotics with some relief.  Patient presents today for further treatment and evaluation.   Past Medical History:  Diagnosis Date  . Anxiety   . Bell's palsy   . Constipation    uses miralax PRN only   . Depression   . Hyperlipidemia   . Hypertension   . NASH (nonalcoholic steatohepatitis)    Followed by Dr. Gerald Dexter     Objective: Physical Exam General: The patient is alert and oriented x3 in no acute distress.  Dermatology: Skin is warm, dry and supple bilateral lower extremities. Negative for open lesions or macerations bilateral.   Vascular: Dorsalis Pedis and Posterior Tibial pulses palpable bilateral.  Capillary fill time is immediate to all digits.  Neurological: Epicritic and protective threshold intact bilateral.   Musculoskeletal: Tenderness to palpation at the medial calcaneal tubercale and through the insertion of the plantar fascia of the right foot. All other joints range of motion within normal limits bilateral. Strength 5/5 in all groups bilateral.    Assessment: 1. Plantar fasciitis right 2. Pain in right foot  Plan of Care:  1. Patient evaluated.  2. Injection of 0.5cc Celestone soluspan injected into the right heel at the insertion of the plantar fascia.  3. Continue wearing plantar fascial brace and custom molded orthotics. 4.  Patient does not like taking oral medication. 5.  Return to clinic as needed.   Edrick Kins, DPM [facility]  Dr. Edrick Kins, [address]                                        [place], [place] [address]                Office [phone]  Fax [phone]

## 2017-04-21 ENCOUNTER — Other Ambulatory Visit: Payer: Self-pay | Admitting: Family

## 2017-05-15 DIAGNOSIS — E785 Hyperlipidemia, unspecified: Secondary | ICD-10-CM | POA: Diagnosis not present

## 2017-05-15 DIAGNOSIS — E559 Vitamin D deficiency, unspecified: Secondary | ICD-10-CM | POA: Diagnosis not present

## 2017-05-15 DIAGNOSIS — K7581 Nonalcoholic steatohepatitis (NASH): Secondary | ICD-10-CM | POA: Diagnosis not present

## 2017-05-17 ENCOUNTER — Telehealth: Payer: Self-pay

## 2017-05-17 DIAGNOSIS — R748 Abnormal levels of other serum enzymes: Secondary | ICD-10-CM

## 2017-05-17 DIAGNOSIS — E876 Hypokalemia: Secondary | ICD-10-CM

## 2017-05-17 LAB — BASIC METABOLIC PANEL
BUN: 6 (ref 4–21)
Creatinine: 0.6 (ref 0.5–1.1)
Glucose: 89
Potassium: 2.9 — AB (ref 3.4–5.3)
Sodium: 140 (ref 137–147)

## 2017-05-17 LAB — HEPATIC FUNCTION PANEL
ALK PHOS: 94 (ref 25–125)
ALT: 18 (ref 7–35)
AST: 16 (ref 13–35)
BILIRUBIN, TOTAL: 1.9

## 2017-05-17 NOTE — Telephone Encounter (Signed)
IT SOUNDS LIKE ALL SHE NEEDS IS LABS.  CAN YOU CONFIRM?

## 2017-05-17 NOTE — Telephone Encounter (Signed)
Spoke to patient and she stated that she just spoke with Dr  Blima Singer [phone] , and I requested labs faxed over. Spoke with Vickii Chafe, RN she will fax labs over.

## 2017-05-17 NOTE — Telephone Encounter (Signed)
cmet ordered

## 2017-05-17 NOTE — Telephone Encounter (Signed)
Copied from [place] [id number]. Topic: Inquiry >> May 17, 2017  1:56 PM Ahmed Prima L wrote: Patient stated she was seen at [facility] on Wednesday for liver disease, her potassium is low & they want her to be seen on Tuesday for a recheck on her labs. If there anyway she can be worked in? Call back is [phone], she said the dr will be faxing her results to the office.

## 2017-05-17 NOTE — Telephone Encounter (Signed)
Yes she just repeat labs, to recheck potassium, due to low potassium.

## 2017-05-20 NOTE — Telephone Encounter (Signed)
Left voice mail to call back ok for [facility] to speak to patient.  Patient needs to be scheduled for lab on Tuesday non-fasting.

## 2017-05-21 ENCOUNTER — Other Ambulatory Visit (INDEPENDENT_AMBULATORY_CARE_PROVIDER_SITE_OTHER): Payer: BLUE CROSS/BLUE SHIELD

## 2017-05-21 ENCOUNTER — Other Ambulatory Visit: Payer: Self-pay

## 2017-05-21 DIAGNOSIS — R748 Abnormal levels of other serum enzymes: Secondary | ICD-10-CM | POA: Diagnosis not present

## 2017-05-21 DIAGNOSIS — E876 Hypokalemia: Secondary | ICD-10-CM

## 2017-05-21 NOTE — Telephone Encounter (Signed)
Pt has an appt scheduled for today at 4pm to have her labs drawn again.

## 2017-05-22 ENCOUNTER — Encounter: Payer: Self-pay | Admitting: Internal Medicine

## 2017-05-22 LAB — COMPREHENSIVE METABOLIC PANEL
ALBUMIN: 4.4 g/dL (ref 3.5–5.2)
ALK PHOS: 78 U/L (ref 39–117)
ALT: 18 U/L (ref 0–35)
AST: 17 U/L (ref 0–37)
BILIRUBIN TOTAL: 1.2 mg/dL (ref 0.2–1.2)
BUN: 15 mg/dL (ref 6–23)
CALCIUM: 9.5 mg/dL (ref 8.4–10.5)
CO2: 28 mEq/L (ref 19–32)
Chloride: 104 mEq/L (ref 96–112)
Creatinine, Ser: 0.75 mg/dL (ref 0.40–1.20)
GFR: 86.08 mL/min (ref >60.00)
GLUCOSE: 88 mg/dL (ref 70–99)
Potassium: 3.7 mEq/L (ref 3.5–5.1)
Sodium: 140 mEq/L (ref 135–145)
TOTAL PROTEIN: 7.4 g/dL (ref 6.0–8.3)

## 2017-05-27 DIAGNOSIS — D1800 Hemangioma unspecified site: Secondary | ICD-10-CM | POA: Diagnosis not present

## 2017-05-27 DIAGNOSIS — D485 Neoplasm of uncertain behavior of skin: Secondary | ICD-10-CM | POA: Diagnosis not present

## 2017-05-27 DIAGNOSIS — L820 Inflamed seborrheic keratosis: Secondary | ICD-10-CM | POA: Diagnosis not present

## 2017-05-27 DIAGNOSIS — Z1283 Encounter for screening for malignant neoplasm of skin: Secondary | ICD-10-CM | POA: Diagnosis not present

## 2017-05-27 DIAGNOSIS — D229 Melanocytic nevi, unspecified: Secondary | ICD-10-CM | POA: Diagnosis not present

## 2017-06-10 DIAGNOSIS — E785 Hyperlipidemia, unspecified: Secondary | ICD-10-CM | POA: Diagnosis not present

## 2017-06-10 DIAGNOSIS — E559 Vitamin D deficiency, unspecified: Secondary | ICD-10-CM | POA: Diagnosis not present

## 2017-06-10 DIAGNOSIS — K7581 Nonalcoholic steatohepatitis (NASH): Secondary | ICD-10-CM | POA: Diagnosis not present

## 2017-07-01 ENCOUNTER — Encounter: Payer: Self-pay | Admitting: Family

## 2017-07-01 ENCOUNTER — Ambulatory Visit: Payer: BLUE CROSS/BLUE SHIELD | Admitting: Family

## 2017-07-01 VITALS — BP 130/90 | HR 66 | Temp 98.4°F | Resp 15 | Wt 182.1 lb

## 2017-07-01 DIAGNOSIS — N281 Cyst of kidney, acquired: Secondary | ICD-10-CM | POA: Diagnosis not present

## 2017-07-01 DIAGNOSIS — N952 Postmenopausal atrophic vaginitis: Secondary | ICD-10-CM

## 2017-07-01 NOTE — Assessment & Plan Note (Signed)
Reassured patient based on her renal function. Finding on right kidney related to cyst?  I have messaged Dr Jacqlyn Larsen and will await his advice as to appropriate next steps.   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

## 2017-07-01 NOTE — Assessment & Plan Note (Signed)
Discussed consult with GYn for alterative treatments. Also would like their opinion on continuing pap smear after atrophy. Unclear by pap if cerivix is there after hysterectomy. Patient in agreement and understands plan.

## 2017-07-01 NOTE — Progress Notes (Signed)
Subjective:    Patient ID: Jessica Spencer, female    DOB: 1965/04/27, 52 y.o.   MRN: [id number]  CC: Jessica Spencer is a 52 y.o. female who presents today for follow up.   HPI: Here to discuss findings on kidney from [place].   Feels well. No dysuria, urinary frequency, back pain.   H/o kidney stone  seeing Dr Edrick Oh, 10/14/17. Had seen  Him for renal cysts, hematuria in which he advised no further evaluation was needed at that time ( reviewed note from 10/2016)  HLD- compliant with medication.   NASH- Follows at [facility] every 6 months.   Depression- started on lexapro in 2015 when having panic attacks for health. Doing well. No si/hi.  Atropy - continues to have pain with intercourse. Vaginal estrogens didn't help and caused her body to itch      pap smear due- atrophy seen on 2015. Unsure if cervix removed H/o hysterectomy  Right kidney pelvic caliectasis 2/19  Prior CT 2015 seen as far back as 08/05/2013   HISTORY:  Past Medical History:  Diagnosis Date  . Anxiety   . Bell's palsy   . Constipation    uses miralax PRN only   . Depression   . Hyperlipidemia   . Hypertension   . NASH (nonalcoholic steatohepatitis)    Followed by Dr. Gerald Dexter   Past Surgical History:  Procedure Laterality Date  . ABDOMINAL HYSTERECTOMY    . ABDOMINAL SURGERY     x3  . APPENDECTOMY    . CARPAL TUNNEL RELEASE Right   . carpal tunnel repair     Onset date 03/2008  . COLONOSCOPY  2004, 2016  . ESOPHAGOGASTRODUODENOSCOPY  2004  . small intestine obstruction    . THYROGLOSSAL DUCT CYST  03/2014   Dr. Pryor Ochoa, [facility]   Family History  Problem Relation Age of Onset  . Cancer Father   . Breast cancer Mother 24  . Colon polyps Mother   . Diverticulosis Mother   . Colon cancer Neg Hx   . Esophageal cancer Neg Hx   . Rectal cancer Neg Hx   . Stomach cancer Neg Hx     Allergies: Aurothioglucose; Fentanyl; Formaldehyde; Gold-containing drug products; Morphine; Nickel;  Sulfamethoxazole-trimethoprim; and Tape Current Outpatient Medications on File Prior to Visit  Medication Sig Dispense Refill  . Cholecalciferol (VITAMIN D) 2000 units CAPS Take by mouth.    . escitalopram (LEXAPRO) 5 MG tablet TAKE 1 TABLET (5 MG TOTAL) BY MOUTH DAILY. 90 tablet 3  . pravastatin (PRAVACHOL) 10 MG tablet TAKE 1 TABLET (10 MG TOTAL) BY MOUTH DAILY. 30 tablet 0  . vitamin E 800 UNIT capsule Take 800 Units by mouth daily.       Current Facility-Administered Medications on File Prior to Visit  Medication Dose Route Frequency Provider Last Rate Last Dose  . betamethasone acetate-betamethasone sodium phosphate (CELESTONE) injection 3 mg  3 mg Intramuscular Once Daylene Katayama M, DPM      . betamethasone acetate-betamethasone sodium phosphate (CELESTONE) injection 3 mg  3 mg Intramuscular Once Daylene Katayama M, DPM      . betamethasone acetate-betamethasone sodium phosphate (CELESTONE) injection 3 mg  3 mg Intramuscular Once Daylene Katayama M, DPM      . betamethasone acetate-betamethasone sodium phosphate (CELESTONE) injection 3 mg  3 mg Intramuscular Once Daylene Katayama M, DPM      . betamethasone acetate-betamethasone sodium phosphate (CELESTONE) injection 3 mg  3 mg Intramuscular Once Edrick Kins, DPM  Social History   Tobacco Use  . Smoking status: Never Smoker  . Smokeless tobacco: Never Used  Substance Use Topics  . Alcohol use: No    Alcohol/week: 0.0 oz  . Drug use: No    Review of Systems  Constitutional: Negative for chills and fever.  Respiratory: Negative for cough.   Cardiovascular: Negative for chest pain and palpitations.  Gastrointestinal: Negative for nausea and vomiting.  Genitourinary: Positive for dyspareunia. Negative for pelvic pain, urgency, vaginal bleeding and vaginal discharge.      Objective:    BP 130/90 (BP Location: Left Arm, Patient Position: Sitting, Cuff Size: Normal)   Pulse 66   Temp 98.4 F (36.9 C) (Oral)   Resp 15   Wt 182 lb  2 oz (82.6 kg)   SpO2 97%   BMI 30.31 kg/m  BP Readings from Last 3 Encounters:  07/01/17 130/90  09/25/16 122/84  03/16/16 132/81   Wt Readings from Last 3 Encounters:  07/01/17 182 lb 2 oz (82.6 kg)  09/25/16 182 lb 3.2 oz (82.6 kg)  03/16/16 175 lb (79.4 kg)    Physical Exam  Constitutional: She appears well-developed and well-nourished.  Eyes: Conjunctivae are normal.  Cardiovascular: Normal rate, regular rhythm, normal heart sounds and normal pulses.  Pulmonary/Chest: Effort normal and breath sounds normal. She has no wheezes. She has no rhonchi. She has no rales.  Neurological: She is alert.  Skin: Skin is warm and dry.  Psychiatric: She has a normal mood and affect. Her speech is normal and behavior is normal. Thought content normal.  Vitals reviewed.      Assessment & Plan:   Problem List Items Addressed This Visit      Genitourinary   Atrophic vaginitis - Primary    Discussed consult with GYn for alterative treatments. Also would like their opinion on continuing pap smear after atrophy. Unclear by pap if cerivix is there after hysterectomy. Patient in agreement and understands plan.      Relevant Orders   Ambulatory referral to Obstetrics / Gynecology   Renal cyst    Reassured patient based on her renal function. Finding on right kidney related to cyst?  I have messaged Dr Jacqlyn Larsen and will await his advice as to appropriate next steps.   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 Spencer maintain her vitamin E, Vitamin D, escitalopram, and pravastatin. We will continue to administer betamethasone acetate-betamethasone sodium phosphate, betamethasone acetate-betamethasone sodium phosphate, betamethasone acetate-betamethasone sodium phosphate, betamethasone acetate-betamethasone sodium phosphate, and betamethasone acetate-betamethasone sodium phosphate.   No orders of the  defined types were placed in this encounter.   Return precautions given.   Risks, benefits, and alternatives of the medications and treatment plan prescribed today were discussed, and patient expressed understanding.   Education regarding symptom management and diagnosis given to patient on AVS.  Continue to follow with Burnard Hawthorne, FNP for routine health maintenance.   Jessica Spencer and I agreed with plan.   Mable Paris, FNP

## 2017-07-01 NOTE — Patient Instructions (Signed)
We will wait to hear from Dr Jacqlyn Larsen.   Referral to GYN for options for atrophy and also based on your history, if you would need to ever have a pap smear again.   Follow up for physical later this year

## 2017-07-02 ENCOUNTER — Telehealth: Payer: Self-pay | Admitting: Obstetrics & Gynecology

## 2017-07-02 NOTE — Telephone Encounter (Signed)
[facility] referring for Atrophic vaginitis. Called and left voicemail for patient to call back to be schedule

## 2017-07-16 ENCOUNTER — Encounter: Payer: Self-pay | Admitting: Obstetrics and Gynecology

## 2017-07-16 ENCOUNTER — Ambulatory Visit (INDEPENDENT_AMBULATORY_CARE_PROVIDER_SITE_OTHER): Payer: BLUE CROSS/BLUE SHIELD | Admitting: Obstetrics and Gynecology

## 2017-07-16 VITALS — BP 142/90 | HR 82 | Ht 65.0 in | Wt 178.0 lb

## 2017-07-16 DIAGNOSIS — Z124 Encounter for screening for malignant neoplasm of cervix: Secondary | ICD-10-CM | POA: Diagnosis not present

## 2017-07-16 DIAGNOSIS — N941 Unspecified dyspareunia: Secondary | ICD-10-CM | POA: Diagnosis not present

## 2017-07-16 DIAGNOSIS — N952 Postmenopausal atrophic vaginitis: Secondary | ICD-10-CM

## 2017-07-16 DIAGNOSIS — Z1151 Encounter for screening for human papillomavirus (HPV): Secondary | ICD-10-CM | POA: Diagnosis not present

## 2017-07-16 MED ORDER — ESTRADIOL 0.1 MG/GM VA CREA: 1.0000 | TOPICAL_CREAM | Freq: Every day | VAGINAL | 0 refills | Status: DC

## 2017-07-16 NOTE — Progress Notes (Signed)
Burnard Hawthorne, FNP   Chief Complaint  Patient presents with  . Vaginitis    HPI:      [name] is a 52 y.o. No obstetric history on file. who LMP was No LMP recorded. Patient has had a hysterectomy., presents today for NP eval of postmenopausal vaginal atrophy and pap smear, referred by PCP. Pt is s/p total hyst BSO age 8 due to endometriosis. Wasn't sex active for awhile so didn't have issues with vag dryness, but now having extreme pain with sex and can't have intercourse. Tried premarin vag crm a few yrs ago with systemic itch, so stopped it. Tried vagifem a few times but stopped it. No known side effects with it.   Has vaginal dryness, particularly with wiping, even if not sex active. Uses scented soap/dryer sheets.   Pt's PCP wants her to have pap but has atrophy. Also unsure if pt has cx or not.    Past Medical History:  Diagnosis Date  . Anxiety   . Bell's palsy   . Constipation    uses miralax PRN only   . Depression   . Hyperlipidemia   . Hypertension   . NASH (nonalcoholic steatohepatitis)    Followed by Dr. Gerald Dexter    Past Surgical History:  Procedure Laterality Date  . ABDOMINAL HYSTERECTOMY    . ABDOMINAL SURGERY     x3  . APPENDECTOMY    . CARPAL TUNNEL RELEASE Right   . carpal tunnel repair     Onset date 03/2008  . COLONOSCOPY  2004, 2016  . ESOPHAGOGASTRODUODENOSCOPY  2004  . small intestine obstruction    . THYROGLOSSAL DUCT CYST  03/2014   Dr. Pryor Ochoa, [facility]    Family History  Problem Relation Age of Onset  . Cancer Father   . Breast cancer Mother 48  . Colon polyps Mother   . Diverticulosis Mother   . Colon cancer Neg Hx   . Esophageal cancer Neg Hx   . Rectal cancer Neg Hx   . Stomach cancer Neg Hx     Social History   Socioeconomic History  . Marital status: Married    Spouse name: Not on file  . Number of children: Not on file  . Years of education: Not on file  . Highest education level: Not on file    Occupational History  . Not on file  Social Needs  . Financial resource strain: Not on file  . Food insecurity:    Worry: Not on file    Inability: Not on file  . Transportation needs:    Medical: Not on file    Non-medical: Not on file  Tobacco Use  . Smoking status: Never Smoker  . Smokeless tobacco: Never Used  Substance and Sexual Activity  . Alcohol use: No    Alcohol/week: 0.0 oz  . Drug use: No  . Sexual activity: Not Currently  Lifestyle  . Physical activity:    Days per week: Not on file    Minutes per session: Not on file  . Stress: Not on file  Relationships  . Social connections:    Talks on phone: Not on file    Gets together: Not on file    Attends religious service: Not on file    Active member of club or organization: Not on file    Attends meetings of clubs or organizations: Not on file    Relationship status: Not on file  . Intimate partner  violence:    Fear of current or ex partner: Not on file    Emotionally abused: Not on file    Physically abused: Not on file    Forced sexual activity: Not on file  Other Topics Concern  . Not on file  Social History Narrative   Lives in [place].   Married.   Enjoys her dogs, beach.    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        Diet - healthy, low fat; not as good as when got dx of NASH.        Exercise- not at this time. Hopes too after things settle with new position at work    Outpatient Medications Prior to Visit  Medication Sig Dispense Refill  . Cholecalciferol (VITAMIN D) 2000 units CAPS Take by mouth.    . escitalopram (LEXAPRO) 5 MG tablet TAKE 1 TABLET (5 MG TOTAL) BY MOUTH DAILY. 90 tablet 3  . pravastatin (PRAVACHOL) 10 MG tablet TAKE 1 TABLET (10 MG TOTAL) BY MOUTH DAILY. 30 tablet 0  . vitamin E 800 UNIT capsule Take 800 Units by mouth daily.       Facility-Administered Medications Prior to Visit  Medication Dose Route Frequency Provider Last Rate Last Dose  . betamethasone  acetate-betamethasone sodium phosphate (CELESTONE) injection 3 mg  3 mg Intramuscular Once Daylene Katayama M, DPM      . betamethasone acetate-betamethasone sodium phosphate (CELESTONE) injection 3 mg  3 mg Intramuscular Once Daylene Katayama M, DPM      . betamethasone acetate-betamethasone sodium phosphate (CELESTONE) injection 3 mg  3 mg Intramuscular Once Daylene Katayama M, DPM      . betamethasone acetate-betamethasone sodium phosphate (CELESTONE) injection 3 mg  3 mg Intramuscular Once Daylene Katayama M, DPM      . betamethasone acetate-betamethasone sodium phosphate (CELESTONE) injection 3 mg  3 mg Intramuscular Once Daylene Katayama M, DPM        ROS:  Review of Systems  Constitutional: Negative for fatigue, fever and unexpected weight change.  Respiratory: Negative for cough, shortness of breath and wheezing.   Cardiovascular: Negative for chest pain, palpitations and leg swelling.  Gastrointestinal: Positive for constipation. Negative for blood in stool, diarrhea, nausea and vomiting.  Endocrine: Negative for cold intolerance, heat intolerance and polyuria.  Genitourinary: Positive for dyspareunia. Negative for dysuria, flank pain, frequency, genital sores, hematuria, menstrual problem, pelvic pain, urgency, vaginal bleeding, vaginal discharge and vaginal pain.  Musculoskeletal: Negative for back pain, joint swelling and myalgias.  Skin: Negative for rash.  Neurological: Positive for headaches. Negative for dizziness, syncope, light-headedness and numbness.  Hematological: Negative for adenopathy.  Psychiatric/Behavioral: Positive for dysphoric mood. Negative for agitation, confusion, sleep disturbance and suicidal ideas. The patient is not nervous/anxious.    BREAST: No symptoms   OBJECTIVE:   Vitals:  BP (!) 142/90   Pulse 82   Ht 5' 5"  (1.651 m)   Wt 178 lb (80.7 kg)   BMI 29.62 kg/m   Physical Exam  Constitutional: She is oriented to person, place, and time and well-developed,  well-nourished, and in no distress. Vital signs are normal.  Genitourinary: Vagina normal, uterus normal, cervix normal, right adnexa normal, left adnexa normal and vulva normal. Uterus is not enlarged. Cervix exhibits no motion tenderness and no tenderness. Right adnexum displays no mass and no tenderness. Left adnexum displays no mass and no tenderness. Vulva exhibits no erythema, no exudate, no lesion, no rash and no tenderness. Vagina exhibits no lesion.  Neurological: She is alert and oriented to person, place, and time.  Psychiatric: Mood, memory, affect and judgment normal.  Vitals reviewed.  Assessment/Plan: Postmenopausal atrophic vaginitis - Able to do exam. Try estrace crm. Rx eRxd. F/u in 2 months. Use dove sens skin soap/line dry underwear/coconut oil.  - Plan: estradiol (ESTRACE) 0.1 MG/GM vaginal cream  Dyspareunia in female - Try coconut oil as lubricant, go slowly, vaginal massage.  Cervical cancer screening - Plan: IGP, Aptima HPV  Screening for HPV (human papillomavirus) - Plan: IGP, Aptima HPV    Meds ordered this encounter  Medications  . estradiol (ESTRACE) 0.1 MG/GM vaginal cream    Sig: Place 1 Applicatorful vaginally at bedtime. Insert 1g nightly for 1 wk, then 1 g once weekly as maintenace    Dispense:  42.5 g    Refill:  0    Order Specific Question:   Supervising Provider    Answer:   Gae Dry [[id number]]      Return in about 2 months (around 09/15/2017) for vag atrophy f/u.  Alicia B. Copland, PA-C 07/16/2017 4:51 PM

## 2017-07-16 NOTE — Patient Instructions (Signed)
I value your feedback and entrusting us with your care. If you get a [facility] patient survey, I would appreciate you taking the time to let us know about your experience today. Thank you! 

## 2017-07-18 LAB — IGP, APTIMA HPV
HPV APTIMA: NEGATIVE
PAP SMEAR COMMENT: 0

## 2017-07-20 ENCOUNTER — Other Ambulatory Visit: Payer: Self-pay | Admitting: Family

## 2017-07-30 ENCOUNTER — Encounter: Payer: Self-pay | Admitting: Family

## 2017-09-16 ENCOUNTER — Ambulatory Visit: Payer: BLUE CROSS/BLUE SHIELD | Admitting: Obstetrics and Gynecology

## 2017-12-04 DIAGNOSIS — E876 Hypokalemia: Secondary | ICD-10-CM | POA: Diagnosis not present

## 2017-12-04 DIAGNOSIS — K7400 Hepatic fibrosis, unspecified: Secondary | ICD-10-CM | POA: Insufficient documentation

## 2017-12-04 DIAGNOSIS — Z006 Encounter for examination for normal comparison and control in clinical research program: Secondary | ICD-10-CM | POA: Diagnosis not present

## 2017-12-04 DIAGNOSIS — E559 Vitamin D deficiency, unspecified: Secondary | ICD-10-CM | POA: Diagnosis not present

## 2017-12-04 DIAGNOSIS — E785 Hyperlipidemia, unspecified: Secondary | ICD-10-CM | POA: Diagnosis not present

## 2017-12-04 DIAGNOSIS — K7581 Nonalcoholic steatohepatitis (NASH): Secondary | ICD-10-CM | POA: Diagnosis not present

## 2017-12-09 ENCOUNTER — Telehealth: Payer: Self-pay | Admitting: Family

## 2017-12-09 NOTE — Telephone Encounter (Signed)
Call patient Reviewing notes from [place].  Please advise patient a follow-up appointment so we  can check her potassium.  It was noted that it is been low in the past.

## 2017-12-13 DIAGNOSIS — R3129 Other microscopic hematuria: Secondary | ICD-10-CM | POA: Diagnosis not present

## 2017-12-13 DIAGNOSIS — K7581 Nonalcoholic steatohepatitis (NASH): Secondary | ICD-10-CM | POA: Diagnosis not present

## 2017-12-13 DIAGNOSIS — N200 Calculus of kidney: Secondary | ICD-10-CM | POA: Diagnosis not present

## 2017-12-13 DIAGNOSIS — K740 Hepatic fibrosis: Secondary | ICD-10-CM | POA: Diagnosis not present

## 2017-12-13 DIAGNOSIS — K760 Fatty (change of) liver, not elsewhere classified: Secondary | ICD-10-CM | POA: Diagnosis not present

## 2017-12-13 DIAGNOSIS — K7460 Unspecified cirrhosis of liver: Secondary | ICD-10-CM | POA: Diagnosis not present

## 2017-12-13 DIAGNOSIS — Z6830 Body mass index (BMI) 30.0-30.9, adult: Secondary | ICD-10-CM | POA: Diagnosis not present

## 2017-12-13 DIAGNOSIS — N393 Stress incontinence (female) (male): Secondary | ICD-10-CM | POA: Diagnosis not present

## 2017-12-18 DIAGNOSIS — R3129 Other microscopic hematuria: Secondary | ICD-10-CM | POA: Diagnosis not present

## 2017-12-24 NOTE — Telephone Encounter (Signed)
Patient has appointment 01/03/18. Is this ok?

## 2017-12-25 NOTE — Telephone Encounter (Signed)
Patient is unable to come in for sooner appointment due to she works out of town.

## 2017-12-25 NOTE — Telephone Encounter (Signed)
Call pt  Her potassium was 2.9 in 04/2017. I would assume this has improved however still a little uncomfortable.   Would she be willing to do labs ahead of CPE on 9/20?  Let me know and we could schedule her fasting  Labs ahead of time.

## 2017-12-25 NOTE — Telephone Encounter (Signed)
Left voice mail to call back ok for P[facility] to speak to patient, regarding below message

## 2017-12-25 NOTE — Telephone Encounter (Signed)
Patient states that she has an appointment on 01-03-18 and will get lab done then. FYI

## 2017-12-25 NOTE — Telephone Encounter (Signed)
Pt states she will be ok coming in for labs before 9/20. Please call pt to schedule.

## 2017-12-25 NOTE — Progress Notes (Signed)
Subjective:    Patient ID: Jessica Spencer, female    DOB: 05-Sep-1965, 52 y.o.   MRN: [id number]  CC: Jessica Spencer is a 52 y.o. female who presents today for physical exam.    HPI: Doing well . No complaints today.   NASH-  Stable. Sees GI every 6 months. Recheck potassium per [facility]  GAD- doing well on lexapro. No si/hi.  HLD- on pravachol , taking 3x per week. LDL 160.    Colorectal Cancer Screening: UTD, Jessica Spencer, repeat 10 years Breast Cancer Screening: Mammogram due 02/2018 Cervical Cancer Screening: UTD, Jessica Spencer 07/2017 Bone Health screening/DEXA for 65+: No increased fracture risk. Defer screening at this time. Lung Cancer Screening: Doesn't have 30 year pack year history and age > 14 years Immunizations       Tetanus - due      Labs: Labs done with [facility] 11/2017 Exercise: Gets regular exercise.  Alcohol use: none Smoking/tobacco use: Nonsmoker.  Regular dental exams: UTD Wears seat belt: Yes. Skin: follows with dermatology; no ho skin cancer.   HISTORY:  Past Medical History:  Diagnosis Date  . Anxiety   . Bell's palsy   . Constipation    uses miralax PRN only   . Depression   . Hyperlipidemia   . Hypertension   . NASH (nonalcoholic steatohepatitis)    Followed by Dr. Gerald Spencer    Past Surgical History:  Procedure Laterality Date  . ABDOMINAL HYSTERECTOMY    . ABDOMINAL SURGERY     x3  . APPENDECTOMY    . CARPAL TUNNEL RELEASE Right   . carpal tunnel repair     Onset date 03/2008  . COLONOSCOPY  2004, 2016  . ESOPHAGOGASTRODUODENOSCOPY  2004  . small intestine obstruction    . THYROGLOSSAL DUCT CYST  03/2014   Dr. Pryor Spencer, [facility]   Family History  Problem Relation Age of Onset  . Cancer Father   . Breast cancer Mother 75  . Colon polyps Mother   . Diverticulosis Mother   . Colon cancer Neg Hx   . Esophageal cancer Neg Hx   . Rectal cancer Neg Hx   . Stomach cancer Neg Hx       ALLERGIES: Aurothioglucose; Fentanyl;  Formaldehyde; Gold-containing drug products; Morphine; Nickel; Sulfamethoxazole-trimethoprim; and Tape  Current Outpatient Medications on File Prior to Visit  Medication Sig Dispense Refill  . Cholecalciferol (VITAMIN D) 2000 units CAPS Take by mouth.    . escitalopram (LEXAPRO) 5 MG tablet TAKE 1 TABLET (5 MG TOTAL) BY MOUTH DAILY. 90 tablet 2  . estradiol (ESTRACE) 0.1 MG/GM vaginal cream Place 1 Applicatorful vaginally at bedtime. Insert 1g nightly for 1 wk, then 1 g once weekly as maintenace 42.5 g 0  . pravastatin (PRAVACHOL) 10 MG tablet TAKE 1 TABLET (10 MG TOTAL) BY MOUTH DAILY. 30 tablet 0  . vitamin E 800 UNIT capsule Take 800 Units by mouth daily.       Current Facility-Administered Medications on File Prior to Visit  Medication Dose Route Frequency Provider Last Rate Last Dose  . betamethasone acetate-betamethasone sodium phosphate (CELESTONE) injection 3 mg  3 mg Intramuscular Once Jessica Spencer M, DPM      . betamethasone acetate-betamethasone sodium phosphate (CELESTONE) injection 3 mg  3 mg Intramuscular Once Jessica Spencer M, DPM      . betamethasone acetate-betamethasone sodium phosphate (CELESTONE) injection 3 mg  3 mg Intramuscular Once Jessica Spencer M, DPM      . betamethasone acetate-betamethasone sodium  phosphate (CELESTONE) injection 3 mg  3 mg Intramuscular Once Jessica Spencer M, DPM      . betamethasone acetate-betamethasone sodium phosphate (CELESTONE) injection 3 mg  3 mg Intramuscular Once Edrick Kins, DPM        Social History   Tobacco Use  . Smoking status: Never Smoker  . Smokeless tobacco: Never Used  Substance Use Topics  . Alcohol use: No    Alcohol/week: 0.0 standard drinks  . Drug use: No    Review of Systems  Constitutional: Negative for chills, fever and unexpected weight change.  HENT: Negative for congestion.   Respiratory: Negative for cough.   Cardiovascular: Negative for chest pain, palpitations and leg swelling.  Gastrointestinal: Negative  for nausea and vomiting.  Genitourinary: Negative for dyspareunia.  Musculoskeletal: Negative for arthralgias and myalgias.  Skin: Negative for rash.  Neurological: Negative for headaches.  Hematological: Negative for adenopathy.  Psychiatric/Behavioral: Negative for confusion.      Objective:    BP 130/85   Pulse 98   Temp 97.9 F (36.6 C) (Oral)   Resp 15   Ht 5' 0.5" (1.537 m)   Wt 176 lb (79.8 kg)   SpO2 98%   BMI 33.81 kg/m   BP Readings from Last 3 Encounters:  01/03/18 130/85  07/16/17 (!) 142/90  07/01/17 130/90   Wt Readings from Last 3 Encounters:  01/03/18 176 lb (79.8 kg)  07/16/17 178 lb (80.7 kg)  07/01/17 182 lb 2 oz (82.6 kg)    Physical Exam  Constitutional: She appears well-developed and well-nourished.  Eyes: Conjunctivae are normal.  Neck: No thyroid mass and no thyromegaly present.  Cardiovascular: Normal rate, regular rhythm, normal heart sounds and normal pulses.  Pulmonary/Chest: Effort normal and breath sounds normal. She has no wheezes. She has no rhonchi. She has no rales. Right breast exhibits no inverted nipple, no mass, no nipple discharge, no skin change and no tenderness. Left breast exhibits no inverted nipple, no mass, no nipple discharge, no skin change and no tenderness. Breasts are symmetrical.  CBE performed.   Lymphadenopathy:       Head (right side): No submental, no submandibular, no tonsillar, no preauricular, no posterior auricular and no occipital adenopathy present.       Head (left side): No submental, no submandibular, no tonsillar, no preauricular, no posterior auricular and no occipital adenopathy present.    She has no cervical adenopathy.       Right cervical: No superficial cervical, no deep cervical and no posterior cervical adenopathy present.      Left cervical: No superficial cervical, no deep cervical and no posterior cervical adenopathy present.    She has no axillary adenopathy.  Neurological: She is alert.    Skin: Skin is warm and dry.  Psychiatric: She has a normal mood and affect. Her speech is normal and behavior is normal. Thought content normal.  Vitals reviewed.      Assessment & Plan:   Problem List Items Addressed This Visit      Digestive   NASH (nonalcoholic steatohepatitis) (Chronic)    Follows with GI. Will follow        Other   Generalized anxiety disorder (Chronic)    Stable. Will continue lexapro.      Routine general medical examination at a health care facility - Primary    Clinical breast exam performed.  Deferred pelvic exam patient is up-to-date on Pap smear and follows with GYN.      Relevant  Orders   MM 3D SCREEN BREAST BILATERAL   Basic metabolic panel   HLD (hyperlipidemia)    Uncontrolled. Declines changing to more potent statin. Encouagred her to take pravachol daily ( she had been on 3x per week).        Other Visit Diagnoses    Need for diphtheria-tetanus-pertussis (Tdap) vaccine       Relevant Orders   Tdap vaccine greater than or equal to 7yo IM (Completed)       I am having Jessica Spencer maintain her vitamin E, Vitamin D, pravastatin, estradiol, and escitalopram. We will continue to administer betamethasone acetate-betamethasone sodium phosphate, betamethasone acetate-betamethasone sodium phosphate, betamethasone acetate-betamethasone sodium phosphate, betamethasone acetate-betamethasone sodium phosphate, and betamethasone acetate-betamethasone sodium phosphate.   No orders of the defined types were placed in this encounter.   Return precautions given.   Risks, benefits, and alternatives of the medications and treatment plan prescribed today were discussed, and patient expressed understanding.   Education regarding symptom management and diagnosis given to patient on AVS.   Continue to follow with Burnard Hawthorne, FNP for routine health maintenance.   Jessica Spencer and I agreed with plan.   Mable Paris,  FNP

## 2018-01-01 ENCOUNTER — Other Ambulatory Visit: Payer: Self-pay

## 2018-01-03 ENCOUNTER — Ambulatory Visit (INDEPENDENT_AMBULATORY_CARE_PROVIDER_SITE_OTHER): Payer: BLUE CROSS/BLUE SHIELD | Admitting: Family

## 2018-01-03 ENCOUNTER — Encounter: Payer: Self-pay | Admitting: Family

## 2018-01-03 VITALS — BP 130/85 | HR 98 | Temp 97.9°F | Resp 15 | Ht 60.5 in | Wt 176.0 lb

## 2018-01-03 DIAGNOSIS — K7581 Nonalcoholic steatohepatitis (NASH): Secondary | ICD-10-CM | POA: Diagnosis not present

## 2018-01-03 DIAGNOSIS — E785 Hyperlipidemia, unspecified: Secondary | ICD-10-CM | POA: Diagnosis not present

## 2018-01-03 DIAGNOSIS — F411 Generalized anxiety disorder: Secondary | ICD-10-CM

## 2018-01-03 DIAGNOSIS — Z0000 Encounter for general adult medical examination without abnormal findings: Secondary | ICD-10-CM | POA: Diagnosis not present

## 2018-01-03 DIAGNOSIS — Z23 Encounter for immunization: Secondary | ICD-10-CM

## 2018-01-03 LAB — BASIC METABOLIC PANEL
BUN: 15 mg/dL (ref 6–23)
CO2: 29 meq/L (ref 19–32)
Calcium: 9.2 mg/dL (ref 8.4–10.5)
Chloride: 104 mEq/L (ref 96–112)
Creatinine, Ser: 0.64 mg/dL (ref 0.40–1.20)
GFR: 103.12 mL/min (ref >60.00)
GLUCOSE: 100 mg/dL — AB (ref 70–99)
Potassium: 3.4 mEq/L — ABNORMAL LOW (ref 3.5–5.1)
Sodium: 142 mEq/L (ref 135–145)

## 2018-01-03 NOTE — Assessment & Plan Note (Signed)
Follows with GI. Will follow

## 2018-01-03 NOTE — Patient Instructions (Signed)
We placed a referral for mammogram this year. I asked that you call one the below locations and schedule this when it is convenient for you.   As discussed, I would like you to ask for 3D mammogram over the traditional 2D mammogram as new evidence suggest 3D is superior.   Please note that NOT all insurance companies cover 3D and you may have to pay a higher copay. You may call your insurance company to further clarify your benefits.   Options for [place]  [place], [place]  * [facility] [place] [place] * Note if you ask for 3D mammogram at this location, you must request [place], [place] location*   Health Maintenance, Female Adopting a healthy lifestyle and getting preventive care can go a long way to promote health and wellness. Talk with your health care provider about what schedule of regular examinations is right for you. This is a good chance for you to check in with your provider about disease prevention and staying healthy. In between checkups, there are plenty of things you can do on your own. Experts have done a lot of research about which lifestyle changes and preventive measures are most likely to keep you healthy. Ask your health care provider for more information. Weight and diet Eat a healthy diet  Be sure to include plenty of vegetables, fruits, low-fat dairy products, and lean protein.  Do not eat a lot of foods high in solid fats, added sugars, or salt.  Get regular exercise. This is one of the most important things you can do for your health. ? Most adults should exercise for at least 150 minutes each week. The exercise should increase your heart rate and make you sweat (moderate-intensity exercise). ? Most adults should also do strengthening exercises at least twice a week. This is in addition to the  moderate-intensity exercise.  Maintain a healthy weight  Body mass index (BMI) is a measurement that can be used to identify possible weight problems. It estimates body fat based on height and weight. Your health care provider can help determine your BMI and help you achieve or maintain a healthy weight.  For females 15 years of age and older: ? A BMI below 18.5 is considered underweight. ? A BMI of 18.5 to 24.9 is normal. ? A BMI of 25 to 29.9 is considered overweight. ? A BMI of 30 and above is considered obese.  Watch levels of cholesterol and blood lipids  You should start having your blood tested for lipids and cholesterol at 53 years of age, then have this test every 5 years.  You may need to have your cholesterol levels checked more often if: ? Your lipid or cholesterol levels are high. ? You are older than 53 years of age. ? You are at high risk for heart disease.  Cancer screening Lung Cancer  Lung cancer screening is recommended for adults 95-55 years old who are at high risk for lung cancer because of a history of smoking.  A yearly low-dose CT scan of the lungs is recommended for people who: ? Currently smoke. ? Have quit within the past 15 years. ? Have at least a 30-pack-year history of smoking. A pack year is smoking an average of one pack of cigarettes a day for 1 year.  Yearly screening should continue until it has  been 15 years since you quit.  Yearly screening should stop if you develop a health problem that would prevent you from having lung cancer treatment.  Breast Cancer  Practice breast self-awareness. This means understanding how your breasts normally appear and feel.  It also means doing regular breast self-exams. Let your health care provider know about any changes, no matter how small.  If you are in your 20s or 30s, you should have a clinical breast exam (CBE) by a health care provider every 1-3 years as part of a regular health exam.  If you  are 56 or older, have a CBE every year. Also consider having a breast X-ray (mammogram) every year.  If you have a family history of breast cancer, talk to your health care provider about genetic screening.  If you are at high risk for breast cancer, talk to your health care provider about having an MRI and a mammogram every year.  Breast cancer gene (BRCA) assessment is recommended for women who have family members with BRCA-related cancers. BRCA-related cancers include: ? Breast. ? Ovarian. ? Tubal. ? Peritoneal cancers.  Results of the assessment will determine the need for genetic counseling and BRCA1 and BRCA2 testing.  Cervical Cancer Your health care provider may recommend that you be screened regularly for cancer of the pelvic organs (ovaries, uterus, and vagina). This screening involves a pelvic examination, including checking for microscopic changes to the surface of your cervix (Pap test). You may be encouraged to have this screening done every 3 years, beginning at age 19.  For women ages 77-65, health care providers may recommend pelvic exams and Pap testing every 3 years, or they may recommend the Pap and pelvic exam, combined with testing for human papilloma virus (HPV), every 5 years. Some types of HPV increase your risk of cervical cancer. Testing for HPV may also be done on women of any age with unclear Pap test results.  Other health care providers may not recommend any screening for nonpregnant women who are considered low risk for pelvic cancer and who do not have symptoms. Ask your health care provider if a screening pelvic exam is right for you.  If you have had past treatment for cervical cancer or a condition that could lead to cancer, you need Pap tests and screening for cancer for at least 20 years after your treatment. If Pap tests have been discontinued, your risk factors (such as having a new sexual partner) need to be reassessed to determine if screening should  resume. Some women have medical problems that increase the chance of getting cervical cancer. In these cases, your health care provider may recommend more frequent screening and Pap tests.  Colorectal Cancer  This type of cancer can be detected and often prevented.  Routine colorectal cancer screening usually begins at 53 years of age and continues through 53 years of age.  Your health care provider may recommend screening at an earlier age if you have risk factors for colon cancer.  Your health care provider may also recommend using home test kits to check for hidden blood in the stool.  A small camera at the end of a tube can be used to examine your colon directly (sigmoidoscopy or colonoscopy). This is done to check for the earliest forms of colorectal cancer.  Routine screening usually begins at age 58.  Direct examination of the colon should be repeated every 5-10 years through 53 years of age. However, you may need to be screened  more often if early forms of precancerous polyps or small growths are found.  Skin Cancer  Check your skin from head to toe regularly.  Tell your health care provider about any new moles or changes in moles, especially if there is a change in a mole's shape or color.  Also tell your health care provider if you have a mole that is larger than the size of a pencil eraser.  Always use sunscreen. Apply sunscreen liberally and repeatedly throughout the day.  Protect yourself by wearing long sleeves, pants, a wide-brimmed hat, and sunglasses whenever you are outside.  Heart disease, diabetes, and high blood pressure  High blood pressure causes heart disease and increases the risk of stroke. High blood pressure is more likely to develop in: ? People who have blood pressure in the high end of the normal range (130-139/85-89 mm Hg). ? People who are overweight or obese. ? People who are African American.  If you are 41-61 years of age, have your blood  pressure checked every 3-5 years. If you are 15 years of age or older, have your blood pressure checked every year. You should have your blood pressure measured twice-once when you are at a hospital or clinic, and once when you are not at a hospital or clinic. Record the average of the two measurements. To check your blood pressure when you are not at a hospital or clinic, you can use: ? An automated blood pressure machine at a pharmacy. ? A home blood pressure monitor.  If you are between 19 years and 36 years old, ask your health care provider if you should take aspirin to prevent strokes.  Have regular diabetes screenings. This involves taking a blood sample to check your fasting blood sugar level. ? If you are at a normal weight and have a low risk for diabetes, have this test once every three years after 53 years of age. ? If you are overweight and have a high risk for diabetes, consider being tested at a younger age or more often. Preventing infection Hepatitis B  If you have a higher risk for hepatitis B, you should be screened for this virus. You are considered at high risk for hepatitis B if: ? You were born in a country where hepatitis B is common. Ask your health care provider which countries are considered high risk. ? Your parents were born in a high-risk country, and you have not been immunized against hepatitis B (hepatitis B vaccine). ? You have HIV or AIDS. ? You use needles to inject street drugs. ? You live with someone who has hepatitis B. ? You have had sex with someone who has hepatitis B. ? You get hemodialysis treatment. ? You take certain medicines for conditions, including cancer, organ transplantation, and autoimmune conditions.  Hepatitis C  Blood testing is recommended for: ? Everyone born from [birth year] through 1966. ? Anyone with known risk factors for hepatitis C.  Sexually transmitted infections (STIs)  You should be screened for sexually transmitted  infections (STIs) including gonorrhea and chlamydia if: ? You are sexually active and are younger than 53 years of age. ? You are older than 53 years of age and your health care provider tells you that you are at risk for this type of infection. ? Your sexual activity has changed since you were last screened and you are at an increased risk for chlamydia or gonorrhea. Ask your health care provider if you are at risk.  If you  do not have HIV, but are at risk, it may be recommended that you take a prescription medicine daily to prevent HIV infection. This is called pre-exposure prophylaxis (PrEP). You are considered at risk if: ? You are sexually active and do not regularly use condoms or know the HIV status of your partner(s). ? You take drugs by injection. ? You are sexually active with a partner who has HIV.  Talk with your health care provider about whether you are at high risk of being infected with HIV. If you choose to begin PrEP, you should first be tested for HIV. You should then be tested every 3 months for as long as you are taking PrEP. Pregnancy  If you are premenopausal and you may become pregnant, ask your health care provider about preconception counseling.  If you may become pregnant, take 400 to 800 micrograms (mcg) of folic acid every day.  If you want to prevent pregnancy, talk to your health care provider about birth control (contraception). Osteoporosis and menopause  Osteoporosis is a disease in which the bones lose minerals and strength with aging. This can result in serious bone fractures. Your risk for osteoporosis can be identified using a bone density scan.  If you are 1 years of age or older, or if you are at risk for osteoporosis and fractures, ask your health care provider if you should be screened.  Ask your health care provider whether you should take a calcium or vitamin D supplement to lower your risk for osteoporosis.  Menopause may have certain physical  symptoms and risks.  Hormone replacement therapy may reduce some of these symptoms and risks. Talk to your health care provider about whether hormone replacement therapy is right for you. Follow these instructions at home:  Schedule regular health, dental, and eye exams.  Stay current with your immunizations.  Do not use any tobacco products including cigarettes, chewing tobacco, or electronic cigarettes.  If you are pregnant, do not drink alcohol.  If you are breastfeeding, limit how much and how often you drink alcohol.  Limit alcohol intake to no more than 1 drink per day for nonpregnant women. One drink equals 12 ounces of beer, 5 ounces of wine, or 1 ounces of hard liquor.  Do not use street drugs.  Do not share needles.  Ask your health care provider for help if you need support or information about quitting drugs.  Tell your health care provider if you often feel depressed.  Tell your health care provider if you have ever been abused or do not feel safe at home. This information is not intended to replace advice given to you by your health care provider. Make sure you discuss any questions you have with your health care provider. Document Released: 10/16/2010 Document Revised: 09/08/2015 Document Reviewed: 01/04/2015 Elsevier Interactive Patient Education  [employer].

## 2018-01-03 NOTE — Assessment & Plan Note (Signed)
Uncontrolled. Declines changing to more potent statin. Encouagred her to take pravachol daily ( she had been on 3x per week).

## 2018-01-03 NOTE — Assessment & Plan Note (Signed)
Stable. Will continue lexapro.

## 2018-01-03 NOTE — Assessment & Plan Note (Signed)
Clinical breast exam performed.  Deferred pelvic exam patient is up-to-date on Pap smear and follows with GYN.

## 2018-01-07 ENCOUNTER — Other Ambulatory Visit: Payer: Self-pay | Admitting: Family

## 2018-01-07 ENCOUNTER — Telehealth: Payer: Self-pay

## 2018-01-07 DIAGNOSIS — E876 Hypokalemia: Secondary | ICD-10-CM

## 2018-01-07 NOTE — Telephone Encounter (Signed)
Copied from [id number]. Topic: Quick Communication - Lab Results >> Jan 07, 2018  9:23 AM Oneta Rack wrote: Relation to pt: self Call back number: [phone]   Reason for call:  Patient had labs drawn 01/03/18 at her physical and extremely anxious regarding results, advised patient PCP is out of the office today and as soon as there released patient would like a follow up call, please advise

## 2018-01-28 DIAGNOSIS — Z23 Encounter for immunization: Secondary | ICD-10-CM | POA: Diagnosis not present

## 2018-01-30 DIAGNOSIS — H01003 Unspecified blepharitis right eye, unspecified eyelid: Secondary | ICD-10-CM | POA: Diagnosis not present

## 2018-02-18 ENCOUNTER — Encounter: Payer: Self-pay | Admitting: Family

## 2018-03-21 DIAGNOSIS — H25011 Cortical age-related cataract, right eye: Secondary | ICD-10-CM | POA: Diagnosis not present

## 2018-04-24 DIAGNOSIS — J029 Acute pharyngitis, unspecified: Secondary | ICD-10-CM | POA: Diagnosis not present

## 2018-04-24 DIAGNOSIS — R52 Pain, unspecified: Secondary | ICD-10-CM | POA: Diagnosis not present

## 2018-04-29 ENCOUNTER — Ambulatory Visit: Payer: BLUE CROSS/BLUE SHIELD | Admitting: Podiatry

## 2018-05-20 ENCOUNTER — Ambulatory Visit: Payer: BLUE CROSS/BLUE SHIELD | Admitting: Podiatry

## 2018-05-20 ENCOUNTER — Encounter: Payer: Self-pay | Admitting: Podiatry

## 2018-05-20 ENCOUNTER — Encounter

## 2018-05-20 DIAGNOSIS — M722 Plantar fascial fibromatosis: Secondary | ICD-10-CM | POA: Diagnosis not present

## 2018-05-20 NOTE — Progress Notes (Signed)
   Subjective: 53 year old female presenting today for follow up evaluation of plantar fasciitis of the right foot. She states the pain started bothering her again last summer. She states the last injection she received provided significant relief and is interested in getting another one. Walking and standing for long periods of time increases the pain. Patient is here for further evaluation and treatment.   Past Medical History:  Diagnosis Date  . Anxiety   . Bell's palsy   . Constipation    uses miralax PRN only   . Depression   . Hyperlipidemia   . Hypertension   . NASH (nonalcoholic steatohepatitis)    Followed by Dr. Gerald Dexter     Objective: Physical Exam General: The patient is alert and oriented x3 in no acute distress.  Dermatology: Skin is warm, dry and supple bilateral lower extremities. Negative for open lesions or macerations bilateral.   Vascular: Dorsalis Pedis and Posterior Tibial pulses palpable bilateral.  Capillary fill time is immediate to all digits.  Neurological: Epicritic and protective threshold intact bilateral.   Musculoskeletal: Tenderness to palpation at the medial calcaneal tubercale and through the insertion of the plantar fascia of the right foot. All other joints range of motion within normal limits bilateral. Strength 5/5 in all groups bilateral.    Assessment: 1. Plantar fasciitis right  Plan of Care:  1. Patient evaluated.  2. Injection of 0.5cc Celestone soluspan injected into the right heel at the insertion of the plantar fascia.  3. Continue using custom molded orthotics. 4.  Patient does not like taking oral medication. 5.  Return to clinic as needed.   Edrick Kins, DPM [facility]  Dr. Edrick Kins, [place]                                        [place],  [address]                Office [phone]  Fax [phone]

## 2018-06-04 DIAGNOSIS — K7581 Nonalcoholic steatohepatitis (NASH): Secondary | ICD-10-CM | POA: Diagnosis not present

## 2018-06-04 DIAGNOSIS — E559 Vitamin D deficiency, unspecified: Secondary | ICD-10-CM | POA: Diagnosis not present

## 2018-06-04 DIAGNOSIS — Z79899 Other long term (current) drug therapy: Secondary | ICD-10-CM | POA: Diagnosis not present

## 2018-06-04 DIAGNOSIS — E785 Hyperlipidemia, unspecified: Secondary | ICD-10-CM | POA: Diagnosis not present

## 2018-06-04 DIAGNOSIS — K740 Hepatic fibrosis: Secondary | ICD-10-CM | POA: Diagnosis not present

## 2018-06-06 ENCOUNTER — Ambulatory Visit
Admission: RE | Admit: 2018-06-06 | Discharge: 2018-06-06 | Disposition: A | Discharge status: Home / Self Care | Payer: BLUE CROSS/BLUE SHIELD | Source: Ambulatory Visit | Attending: Family | Admitting: Family

## 2018-06-06 DIAGNOSIS — Z1231 Encounter for screening mammogram for malignant neoplasm of breast: Secondary | ICD-10-CM | POA: Insufficient documentation

## 2018-06-06 DIAGNOSIS — Z0000 Encounter for general adult medical examination without abnormal findings: Secondary | ICD-10-CM | POA: Diagnosis not present

## 2018-07-16 DIAGNOSIS — L821 Other seborrheic keratosis: Secondary | ICD-10-CM | POA: Diagnosis not present

## 2018-07-16 DIAGNOSIS — Z1283 Encounter for screening for malignant neoplasm of skin: Secondary | ICD-10-CM | POA: Diagnosis not present

## 2018-07-16 DIAGNOSIS — L814 Other melanin hyperpigmentation: Secondary | ICD-10-CM | POA: Diagnosis not present

## 2018-07-16 DIAGNOSIS — D485 Neoplasm of uncertain behavior of skin: Secondary | ICD-10-CM | POA: Diagnosis not present

## 2018-08-13 ENCOUNTER — Other Ambulatory Visit: Payer: Self-pay | Admitting: Internal Medicine

## 2018-10-18 ENCOUNTER — Other Ambulatory Visit: Payer: Self-pay | Admitting: Family

## 2018-10-20 MED ORDER — ESCITALOPRAM OXALATE 5 MG PO TABS: 5.0000 mg | ORAL_TABLET | Freq: Every day | ORAL | 0 refills | Status: DC

## 2019-01-11 ENCOUNTER — Other Ambulatory Visit: Payer: Self-pay | Admitting: Family

## 2019-01-29 DIAGNOSIS — Z23 Encounter for immunization: Secondary | ICD-10-CM | POA: Diagnosis not present

## 2019-02-10 DIAGNOSIS — E785 Hyperlipidemia, unspecified: Secondary | ICD-10-CM | POA: Diagnosis not present

## 2019-02-10 DIAGNOSIS — K7581 Nonalcoholic steatohepatitis (NASH): Secondary | ICD-10-CM | POA: Diagnosis not present

## 2019-02-10 DIAGNOSIS — E559 Vitamin D deficiency, unspecified: Secondary | ICD-10-CM | POA: Diagnosis not present

## 2019-02-16 ENCOUNTER — Other Ambulatory Visit: Payer: Self-pay | Admitting: Family

## 2019-03-03 DIAGNOSIS — K7581 Nonalcoholic steatohepatitis (NASH): Secondary | ICD-10-CM | POA: Diagnosis not present

## 2019-03-11 ENCOUNTER — Other Ambulatory Visit: Payer: Self-pay | Admitting: Family

## 2019-06-23 DIAGNOSIS — Z23 Encounter for immunization: Secondary | ICD-10-CM | POA: Diagnosis not present

## 2019-07-16 ENCOUNTER — Other Ambulatory Visit: Payer: Self-pay

## 2019-07-16 ENCOUNTER — Ambulatory Visit: Payer: BC Managed Care – PPO | Admitting: Dermatology

## 2019-07-16 DIAGNOSIS — L719 Rosacea, unspecified: Secondary | ICD-10-CM | POA: Diagnosis not present

## 2019-07-16 DIAGNOSIS — L853 Xerosis cutis: Secondary | ICD-10-CM | POA: Diagnosis not present

## 2019-07-16 DIAGNOSIS — D225 Melanocytic nevi of trunk: Secondary | ICD-10-CM | POA: Diagnosis not present

## 2019-07-16 DIAGNOSIS — D492 Neoplasm of unspecified behavior of bone, soft tissue, and skin: Secondary | ICD-10-CM

## 2019-07-16 DIAGNOSIS — L814 Other melanin hyperpigmentation: Secondary | ICD-10-CM

## 2019-07-16 DIAGNOSIS — Z1283 Encounter for screening for malignant neoplasm of skin: Secondary | ICD-10-CM | POA: Diagnosis not present

## 2019-07-16 DIAGNOSIS — L578 Other skin changes due to chronic exposure to nonionizing radiation: Secondary | ICD-10-CM

## 2019-07-16 DIAGNOSIS — D229 Melanocytic nevi, unspecified: Secondary | ICD-10-CM

## 2019-07-16 DIAGNOSIS — Z86018 Personal history of other benign neoplasm: Secondary | ICD-10-CM

## 2019-07-16 DIAGNOSIS — L821 Other seborrheic keratosis: Secondary | ICD-10-CM

## 2019-07-16 MED ORDER — AMBULATORY NON FORMULARY MEDICATION: 1.0000 "application " | Freq: Every day | 2 refills | Status: AC

## 2019-07-16 NOTE — Progress Notes (Signed)
   Follow-Up Visit   Subjective  Jessica Spencer is a 54 y.o. female who presents for the following: Annual Exam (1 year TBSE, hx of Aks ).  Patient has history of significant sun exposure and has several spots and moles he would like checked they may or may have not have changed.  She would like skin screening for skin cancer today.  The following portions of the chart were reviewed this encounter and updated as appropriate:     Review of Systems: No other skin or systemic complaints.  Objective  Well appearing patient in no apparent distress; mood and affect are within normal limits.  A full examination was performed including scalp, head, eyes, ears, nose, lips, neck, chest, axillae, abdomen, back, buttocks, bilateral upper extremities, bilateral lower extremities, hands, feet, fingers, toes, fingernails, and toenails. All findings within normal limits unless otherwise noted below.  Objective  Mid to upper back: 0.6cm irregular brown macule  Objective  face, cheeks: Mid face erythema with telangiectasias +/- scattered inflammatory papules.   Objective  arms, legs: Dry skin   Assessment & Plan   Skin cancer screening performed today. Marland Kitchenqk  Actinic Damage - diffuse scaly erythematous macules with underlying dyspigmentation - Recommend daily broad spectrum sunscreen SPF 30+ to sun-exposed areas, reapply every 2 hours as needed.  - Call for new or changing lesions. Melanocytic Nevi - Tan-brown and/or pink-flesh-colored symmetric macules and papules - Benign appearing on exam today - Observation - Call clinic for new or changing moles - Recommend daily use of broad spectrum spf 30+ sunscreen to sun-exposed areas.  Seborrheic Keratoses - Stuck-on, waxy, tan-brown papules and plaques  - Discussed benign etiology and prognosis. - Observe - Call for any changes Lentigines - Scattered tan macules - Discussed due to sun exposure - Benign, observe - Call for any  changes    Hx of dysplastic nevus multiple locations  Observe   Neoplasm of skin Mid to upper back  Epidermal / dermal shaving  Lesion length (cm):  0.6 Lesion width (cm):  0.6 Margin per side (cm):  0.2 Total excision diameter (cm):  1 Informed consent: discussed and consent obtained   Timeout: patient name, date of birth, surgical site, and procedure verified   Procedure prep:  Patient was prepped and draped in usual sterile fashion Prep type:  Isopropyl alcohol Anesthesia: the lesion was anesthetized in a standard fashion   Anesthetic:  1% lidocaine w/ epinephrine 1-100,000 buffered w/ 8.4% NaHCO3 Hemostasis achieved with: pressure, aluminum chloride and electrodesiccation   Outcome: patient tolerated procedure well   Post-procedure details: sterile dressing applied and wound care instructions given   Dressing type: bandage and petrolatum    Specimen 1 - Surgical pathology Differential Diagnosis: R/O Dysplastic nevus  Check Margins: No    Rosacea face, cheeks  Start Skin medicinals Rosacea cream apply to face qhs   AMBULATORY NON FORMULARY MEDICATION - face, cheeks  Xerosis cutis arms, legs  Start otc Cerave cream qd-bid   Return in about 1 year (around 07/15/2020) for TBSE, 3 months Rosacea .   IMarye Round, CMA, am acting as scribe for Sarina Ser, MD .

## 2019-07-19 ENCOUNTER — Encounter: Payer: Self-pay | Admitting: Family

## 2019-07-20 ENCOUNTER — Other Ambulatory Visit: Payer: Self-pay | Admitting: Family

## 2019-07-20 DIAGNOSIS — Z1231 Encounter for screening mammogram for malignant neoplasm of breast: Secondary | ICD-10-CM

## 2019-07-21 DIAGNOSIS — Z23 Encounter for immunization: Secondary | ICD-10-CM | POA: Diagnosis not present

## 2019-07-23 ENCOUNTER — Telehealth: Payer: Self-pay

## 2019-07-23 NOTE — Telephone Encounter (Signed)
Patient informed of pathology results. Will send message to the front desk to schedule surgery appointment.

## 2019-08-12 DIAGNOSIS — E785 Hyperlipidemia, unspecified: Secondary | ICD-10-CM | POA: Diagnosis not present

## 2019-08-12 DIAGNOSIS — K7400 Hepatic fibrosis, unspecified: Secondary | ICD-10-CM | POA: Diagnosis not present

## 2019-08-12 DIAGNOSIS — K7581 Nonalcoholic steatohepatitis (NASH): Secondary | ICD-10-CM | POA: Diagnosis not present

## 2019-08-12 DIAGNOSIS — E559 Vitamin D deficiency, unspecified: Secondary | ICD-10-CM | POA: Diagnosis not present

## 2019-09-01 ENCOUNTER — Encounter: Payer: Self-pay | Admitting: Dermatology

## 2019-09-01 ENCOUNTER — Other Ambulatory Visit: Payer: Self-pay

## 2019-09-01 ENCOUNTER — Ambulatory Visit: Payer: BC Managed Care – PPO | Admitting: Dermatology

## 2019-09-01 DIAGNOSIS — D239 Other benign neoplasm of skin, unspecified: Secondary | ICD-10-CM

## 2019-09-01 DIAGNOSIS — L988 Other specified disorders of the skin and subcutaneous tissue: Secondary | ICD-10-CM | POA: Diagnosis not present

## 2019-09-01 DIAGNOSIS — D235 Other benign neoplasm of skin of trunk: Secondary | ICD-10-CM

## 2019-09-01 MED ORDER — MUPIROCIN 2 % EX OINT: 1.0000 "application " | TOPICAL_OINTMENT | Freq: Every day | CUTANEOUS | 0 refills | Status: AC

## 2019-09-01 NOTE — Progress Notes (Signed)
   Follow-Up Visit   Subjective  Jessica Spencer is a 54 y.o. female who presents for the following: Procedure (Biopsy proven severe dysplastic nevus of mid to upper back - [facility] today.).   The following portions of the chart were reviewed this encounter and updated as appropriate:  Tobacco  Allergies  Meds  Problems  Med Hx  Surg Hx  Fam Hx      Review of Systems:  No other skin or systemic complaints except as noted in HPI or Assessment and Plan.  Objective  Well appearing patient in no apparent distress; mood and affect are within normal limits.  A focused examination was performed including back. Relevant physical exam findings are noted in the Assessment and Plan.  Objective  Mid to upper back: Healing biopsy site   Assessment & Plan  Dysplastic nevus Mid to upper back  Biopsy proven severe dysplastic nevus  Skin excision - Mid to upper back  Lesion length (cm):  1 Lesion width (cm):  1 Margin per side (cm):  0.2 Total excision diameter (cm):  1.4 Informed consent: discussed and consent obtained   Timeout: patient name, date of birth, surgical site, and procedure verified   Procedure prep:  Patient was prepped and draped in usual sterile fashion Prep type:  Isopropyl alcohol and povidone-iodine Anesthesia: the lesion was anesthetized in a standard fashion   Anesthetic:  1% lidocaine w/ epinephrine 1-100,000 buffered w/ 8.4% NaHCO3 (10cc) Instrument used: #15 blade   Hemostasis achieved with: pressure   Hemostasis achieved with comment:  Electrocautery Outcome: patient tolerated procedure well with no complications   Post-procedure details: sterile dressing applied and wound care instructions given   Dressing type: bandage and pressure dressing (mupirocin)    Skin repair - Mid to upper back Complexity:  Complex Final length (cm):  4 Informed consent: discussed and consent obtained   Reason for type of repair: reduce tension to allow closure,  reduce the risk of dehiscence, infection, and necrosis, reduce subcutaneous dead space and avoid a hematoma, allow closure of the large defect, preserve normal anatomy, preserve normal anatomical and functional relationships and enhance both functionality and cosmetic results   Undermining: area extensively undermined   Undermining comment:  Undermining defect 2.0cm Subcutaneous layers (deep stitches):  Suture size:  2-0 Suture type: Vicryl (polyglactin 910)   Subcutaneous suture technique: inverted dermal. Fine/surface layer approximation (top stitches):  Suture size:  3-0 Suture type comment:  Nylon Stitches: simple running   Suture removal (days):  7 Hemostasis achieved with: suture and pressure Hemostasis achieved with comment:  Electrocautery Outcome: patient tolerated procedure well with no complications   Post-procedure details: sterile dressing applied and wound care instructions given   Dressing type: bandage and pressure dressing (mupirocin)    mupirocin ointment (BACTROBAN) 2 % - Mid to upper back  Specimen 1 - Surgical pathology Differential Diagnosis:Biopsy proven severe dysplastic nevus Check Margins: Yes Healing biopsy site [id number]  Return in about 1 week (around 09/08/2019) for suture removal.   I, Ashok Cordia, CMA, am acting as scribe for Sarina Ser, MD .  Documentation: I have reviewed the above documentation for accuracy and completeness, and I agree with the above.  Sarina Ser, MD

## 2019-09-01 NOTE — Patient Instructions (Signed)

## 2019-09-02 ENCOUNTER — Encounter: Payer: Self-pay | Admitting: Dermatology

## 2019-09-08 ENCOUNTER — Other Ambulatory Visit: Payer: Self-pay

## 2019-09-08 ENCOUNTER — Ambulatory Visit (INDEPENDENT_AMBULATORY_CARE_PROVIDER_SITE_OTHER): Payer: BC Managed Care – PPO | Admitting: Dermatology

## 2019-09-08 DIAGNOSIS — Z4802 Encounter for removal of sutures: Secondary | ICD-10-CM

## 2019-09-08 DIAGNOSIS — Z86018 Personal history of other benign neoplasm: Secondary | ICD-10-CM

## 2019-09-08 NOTE — Progress Notes (Signed)
   Follow-Up Visit   Subjective  Jessica Spencer is a 54 y.o. female who presents for the following: post op/suture removal (severely dysplastic nevus of the mid to upper back - healing OK per patient).  The following portions of the chart were reviewed this encounter and updated as appropriate:  Tobacco  Allergies  Meds  Problems  Med Hx  Surg Hx  Fam Hx     Review of Systems:  No other skin or systemic complaints except as noted in HPI or Assessment and Plan.  Objective  Well appearing patient in no apparent distress; mood and affect are within normal limits.  A focused examination was performed including the back. Relevant physical exam findings are noted in the Assessment and Plan.  Objective  mid to upper back: Healing excision site   Assessment & Plan  History of dysplastic nevus mid to upper back  Encounter for Removal of Sutures - Incision site at the mid to upper back is clean, dry and intact - Wound cleansed, sutures removed, wound cleansed and steri strips applied.  - Discussed pathology results showing margins free severely dysplastic nevus - Patient advised to keep steri-strips dry until they fall off. - Scars remodel for a full year. - Once steri-strips fall off, patient can apply over-the-counter silicone scar cream each night to help with scar remodeling if desired. - Patient advised to call with any concerns or if they notice any new or changing lesions.   Return for appointment as scheduled.  Luther Redo, CMA, am acting as scribe for Sarina Ser, MD . Documentation: I have reviewed the above documentation for accuracy and completeness, and I agree with the above.  Sarina Ser, MD

## 2019-09-08 NOTE — Patient Instructions (Signed)
After Suture Removal ° °1. After sutures are removed, the wound should be coated with an antibiotic ointment (eg. Polysporin, Bacitracin) and, if possible, kept covered with a Band-Aid or bandage for an additional 24 hours.  After that, no additional wound care is generally needed.  °2. It is alright to get the area wet. °3. If a skin cancer was removed, your skin should be re-examined in approximately three months. ° ° ° °

## 2019-09-09 ENCOUNTER — Other Ambulatory Visit: Payer: Self-pay | Admitting: Family

## 2019-09-09 ENCOUNTER — Encounter: Payer: Self-pay | Admitting: Dermatology

## 2019-09-09 DIAGNOSIS — K7400 Hepatic fibrosis, unspecified: Secondary | ICD-10-CM | POA: Diagnosis not present

## 2019-09-09 DIAGNOSIS — K7581 Nonalcoholic steatohepatitis (NASH): Secondary | ICD-10-CM | POA: Diagnosis not present

## 2019-09-09 DIAGNOSIS — K760 Fatty (change of) liver, not elsewhere classified: Secondary | ICD-10-CM | POA: Diagnosis not present

## 2019-09-15 ENCOUNTER — Ambulatory Visit
Admission: RE | Admit: 2019-09-15 | Discharge: 2019-09-15 | Disposition: A | Discharge status: Home / Self Care | Payer: BC Managed Care – PPO | Source: Ambulatory Visit | Attending: Family | Admitting: Family

## 2019-09-15 DIAGNOSIS — Z1231 Encounter for screening mammogram for malignant neoplasm of breast: Secondary | ICD-10-CM | POA: Diagnosis not present

## 2019-10-02 DIAGNOSIS — H2513 Age-related nuclear cataract, bilateral: Secondary | ICD-10-CM | POA: Diagnosis not present

## 2019-10-27 ENCOUNTER — Ambulatory Visit: Payer: BC Managed Care – PPO | Admitting: Dermatology

## 2019-10-27 ENCOUNTER — Encounter: Payer: Self-pay | Admitting: Dermatology

## 2019-10-27 ENCOUNTER — Other Ambulatory Visit: Payer: Self-pay

## 2019-10-27 DIAGNOSIS — L719 Rosacea, unspecified: Secondary | ICD-10-CM | POA: Diagnosis not present

## 2019-10-27 DIAGNOSIS — L820 Inflamed seborrheic keratosis: Secondary | ICD-10-CM

## 2019-10-27 DIAGNOSIS — L821 Other seborrheic keratosis: Secondary | ICD-10-CM | POA: Diagnosis not present

## 2019-10-27 DIAGNOSIS — L738 Other specified follicular disorders: Secondary | ICD-10-CM | POA: Diagnosis not present

## 2019-10-27 NOTE — Progress Notes (Signed)
   Follow-Up Visit   Subjective  Jessica Spencer is a 54 y.o. female who presents for the following: Rosacea (face, 78mf/u, using skin medicinals triple cream 2-3x/wk). She also wants treatment of growing irritating spots of face.  The following portions of the chart were reviewed this encounter and updated as appropriate:  Tobacco  Allergies  Meds  Problems  Med Hx  Surg Hx  Fam Hx      Review of Systems:  No other skin or systemic complaints except as noted in HPI or Assessment and Plan.  Objective  Well appearing patient in no apparent distress; mood and affect are within normal limits.  A focused examination was performed including face. Relevant physical exam findings are noted in the Assessment and Plan.  Objective  mid face: Mid face erythema with telangiectasias   Objective  Head - Anterior (Face): Small yellow papules with a central dell.   Objective  face: Stuck-on, waxy, tan-brown papules and plaques -- Discussed benign etiology and prognosis.   Objective  Face x 19 (19), Right Upper Eyelid x 1: Erythematous keratotic or waxy stuck-on papule or plaque.    Assessment & Plan     Rosacea mid face Improved  May cont Skin Medicinals triple cream prn flares/paps  Other Related Medications AMBULATORY NON FORMULARY MEDICATION  Sebaceous hyperplasia Head - Anterior (Face) Benign, Observe Discussed treatment with ED  Seborrheic keratosis face  Inflamed seborrheic keratosis (20) Face x 19 (19); Right Upper Eyelid x 1  Destruction of lesion - Face x 19 Complexity: simple   Destruction method: cryotherapy   Informed consent: discussed and consent obtained   Timeout:  patient name, date of birth, surgical site, and procedure verified Lesion destroyed using liquid nitrogen: Yes   Region frozen until ice ball extended beyond lesion: Yes   Outcome: patient tolerated procedure well with no complications   Post-procedure details: wound care  instructions given    Return for as scheduled 07/20/2020 for TBSE.   I, SOthelia Pulling RMA, am acting as scribe for DSarina Ser MD .  Documentation: I have reviewed the above documentation for accuracy and completeness, and I agree with the above.  DSarina Ser MD

## 2019-10-27 NOTE — Patient Instructions (Signed)
Cryotherapy Aftercare  . Wash gently with soap and water everyday.   . Apply Vaseline and Band-Aid daily until healed.  

## 2019-12-24 ENCOUNTER — Telehealth: Payer: Self-pay

## 2019-12-24 NOTE — Telephone Encounter (Signed)
Patient was here in July to see you and had some inflammed SKs treated. Patient had more to treat but you advised her they would need a laser treatment. Patient has called wanting to schedule but we need to confirm if that is with you or with Sonia Baller?

## 2019-12-24 NOTE — Telephone Encounter (Signed)
The laser treatment would be with Jessica Spencer. She has brown spots and sun damage that would be best treated with laser. Jessica Spencer should evaluate her this fall and treat when pt ready - Fall or Winter preferrred. If she has other Sks to be treated, they can be frozen with Ln2 if they are thicker ones.

## 2019-12-29 NOTE — Telephone Encounter (Signed)
Sonia Baller I scheduled this patient for you towards the end of October and gave her a hour. Is this okay?

## 2019-12-29 NOTE — Telephone Encounter (Signed)
Left message for patient to return my call.

## 2020-01-04 ENCOUNTER — Ambulatory Visit: Payer: BC Managed Care – PPO

## 2020-01-14 DIAGNOSIS — Z23 Encounter for immunization: Secondary | ICD-10-CM | POA: Diagnosis not present

## 2020-02-08 ENCOUNTER — Ambulatory Visit (INDEPENDENT_AMBULATORY_CARE_PROVIDER_SITE_OTHER): Payer: Self-pay

## 2020-02-08 ENCOUNTER — Other Ambulatory Visit: Payer: Self-pay

## 2020-02-08 DIAGNOSIS — I781 Nevus, non-neoplastic: Secondary | ICD-10-CM

## 2020-02-08 DIAGNOSIS — L719 Rosacea, unspecified: Secondary | ICD-10-CM

## 2020-02-08 DIAGNOSIS — L578 Other skin changes due to chronic exposure to nonionizing radiation: Secondary | ICD-10-CM

## 2020-02-08 NOTE — Progress Notes (Signed)
Maintenance BBL. She had IPL many years ago. No hx of fever blisters. Nice response to tx. jj

## 2020-02-16 DIAGNOSIS — E785 Hyperlipidemia, unspecified: Secondary | ICD-10-CM | POA: Diagnosis not present

## 2020-02-16 DIAGNOSIS — K7581 Nonalcoholic steatohepatitis (NASH): Secondary | ICD-10-CM | POA: Diagnosis not present

## 2020-02-16 DIAGNOSIS — K7400 Hepatic fibrosis, unspecified: Secondary | ICD-10-CM | POA: Diagnosis not present

## 2020-02-16 DIAGNOSIS — E559 Vitamin D deficiency, unspecified: Secondary | ICD-10-CM | POA: Diagnosis not present

## 2020-03-14 DIAGNOSIS — K7400 Hepatic fibrosis, unspecified: Secondary | ICD-10-CM | POA: Diagnosis not present

## 2020-03-14 DIAGNOSIS — K7581 Nonalcoholic steatohepatitis (NASH): Secondary | ICD-10-CM | POA: Diagnosis not present

## 2020-03-15 ENCOUNTER — Other Ambulatory Visit: Payer: Self-pay | Admitting: Family

## 2020-06-20 DIAGNOSIS — S39012A Strain of muscle, fascia and tendon of lower back, initial encounter: Secondary | ICD-10-CM | POA: Diagnosis not present

## 2020-07-20 ENCOUNTER — Encounter: Payer: Self-pay | Admitting: Dermatology

## 2020-08-16 DIAGNOSIS — E785 Hyperlipidemia, unspecified: Secondary | ICD-10-CM | POA: Diagnosis not present

## 2020-08-16 DIAGNOSIS — K7400 Hepatic fibrosis, unspecified: Secondary | ICD-10-CM | POA: Diagnosis not present

## 2020-08-16 DIAGNOSIS — K7581 Nonalcoholic steatohepatitis (NASH): Secondary | ICD-10-CM | POA: Diagnosis not present

## 2020-08-16 DIAGNOSIS — E559 Vitamin D deficiency, unspecified: Secondary | ICD-10-CM | POA: Diagnosis not present

## 2020-09-11 ENCOUNTER — Encounter: Payer: Self-pay | Admitting: Physician Assistant

## 2020-09-11 ENCOUNTER — Telehealth: Payer: BC Managed Care – PPO | Admitting: Physician Assistant

## 2020-09-11 DIAGNOSIS — K644 Residual hemorrhoidal skin tags: Secondary | ICD-10-CM

## 2020-09-11 MED ORDER — HYDROCORTISONE ACETATE 25 MG RE SUPP: 25.0000 mg | Freq: Two times a day (BID) | RECTAL | 0 refills | Status: AC

## 2020-09-11 NOTE — Progress Notes (Signed)
Virtual Visit via Video Note  I connected with Jessica Spencer on 09/11/20 at  2:45 PM EDT by a video enabled telemedicine application and verified that I am speaking with the correct person using two identifiers.  Location: Patient: Patient's home  Provider: Provider's office    Person participating in the virtual visit: patient and provider    I discussed the limitations of evaluation and management by telemedicine and the availability of in person appointments. The patient expressed understanding and agreed to proceed.  History of Present Illness:    Observations/Objective:   Assessment and Plan:   Follow Up Instructions:    I discussed the assessment and treatment plan with the patient. The patient was provided an opportunity to ask questions and all were answered. The patient agreed with the plan and demonstrated an understanding of the instructions.   The patient was advised to call back or seek an in-person evaluation if the symptoms worsen or if the condition fails to improve as anticipated.  I provided 15 minutes of non-face-to-face time during this encounter.   Waldon Merl, PA-C    Acute Office Visit  Subjective:    Patient ID: Jessica Spencer, female    DOB: September 16, 1965, 55 y.o.   MRN: [id number]  No chief complaint on file.   55 yo F in NAD on video, woth PMH of external hemorrhoids, presents with flare of hemorrhoids x 5 days. Has rectal pain, worse with straining on bowel movements. Feels bulge on rectum. Has been using OTC preparation H without relief of symptoms. Denies any blood when she wipes, on stool, or on toilet. Admits she suffers from constipation, and uses Miralax on and off, has not been using it recently. Denies any fever, chills, cp, dyspnea, abd pain, n, v, dysuria, urinary changes, vaginal pain, vaginal discharge, or genital rash.  Other Pertinent negatives include no abdominal pain, chest pain, chills, congestion, coughing,  diaphoresis, fatigue, fever, headaches, myalgias, nausea, rash, sore throat or vomiting.   Patient is in today for "hemorrhoids"  Past Medical History:  Diagnosis Date  . Anxiety   . Bell's palsy   . Constipation    uses miralax PRN only   . Depression   . Dysplastic nevus 07/16/2006   Right sup. medial buttocks. Slight to moderate atypia.   Marland Kitchen Dysplastic nevus 03/16/2013   Right paraspinal mid to low back. Moderate atypia. Limited margins free.  Marland Kitchen Dysplastic nevus 07/16/2019   Mid to upper back. Moderate to severe atypia. Deep margin involved. Excised 09/01/2019, margins free.   Marland Kitchen Hyperlipidemia   . Hypertension   . NASH (nonalcoholic steatohepatitis)    Followed by Dr. Gerald Dexter    Past Surgical History:  Procedure Laterality Date  . ABDOMINAL HYSTERECTOMY    . ABDOMINAL SURGERY     x3  . APPENDECTOMY    . CARPAL TUNNEL RELEASE Right   . carpal tunnel repair     Onset date 03/2008  . COLONOSCOPY  2004, 2016  . ESOPHAGOGASTRODUODENOSCOPY  2004  . small intestine obstruction    . THYROGLOSSAL DUCT CYST  03/2014   Dr. Pryor Ochoa, [facility]    Family History  Problem Relation Age of Onset  . Cancer Father   . Breast cancer Mother 65  . Colon polyps Mother   . Diverticulosis Mother   . Breast cancer Paternal Aunt        1/2  . Colon cancer Neg Hx   . Esophageal cancer Neg Hx   . Rectal  cancer Neg Hx   . Stomach cancer Neg Hx     Social History   Socioeconomic History  . Marital status: Married    Spouse name: Not on file  . Number of children: Not on file  . Years of education: Not on file  . Highest education level: Not on file  Occupational History  . Not on file  Tobacco Use  . Smoking status: Never Smoker  . Smokeless tobacco: Never Used  Vaping Use  . Vaping Use: Never used  Substance and Sexual Activity  . Alcohol use: No    Alcohol/week: 0.0 standard drinks  . Drug use: No  . Sexual activity: Not Currently  Other Topics Concern  . Not on file  Social  History Narrative   Lives in [place].   Married.   Enjoys her dogs, beach.    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        Diet - healthy, low fat; not as good as when got dx of NASH.        Exercise- not at this time. Hopes too after things settle with new position at work   Social Determinants of Radio broadcast assistant Strain: Not on file  Food Insecurity: Not on file  Transportation Needs: Not on file  Physical Activity: Not on file  Stress: Not on file  Social Connections: Not on file  Intimate Partner Violence: Not on file    Outpatient Medications Prior to Visit  Medication Sig Dispense Refill  . AMBULATORY NON FORMULARY MEDICATION Apply 1 application topically at bedtime. Medication Name: Azelaic Acid 15%, Metronidazole 1%, Ivermectin 1% Cream (Skin Medicinals) 30 mg 2  . Cholecalciferol (VITAMIN D) 2000 units CAPS Take by mouth.    . escitalopram (LEXAPRO) 5 MG tablet TAKE 1 TABLET BY MOUTH EVERY DAY 90 tablet 1  . estradiol (ESTRACE) 0.1 MG/GM vaginal cream Place 1 Applicatorful vaginally at bedtime. Insert 1g nightly for 1 wk, then 1 g once weekly as maintenace 42.5 g 0  . mupirocin ointment (BACTROBAN) 2 % Apply 1 application topically daily. With dressing changes 22 g 0  . pravastatin (PRAVACHOL) 10 MG tablet TAKE 1 TABLET (10 MG TOTAL) BY MOUTH DAILY. 30 tablet 0  . vitamin E 800 UNIT capsule Take 800 Units by mouth daily.       Facility-Administered Medications Prior to Visit  Medication Dose Route Frequency Provider Last Rate Last Admin  . betamethasone acetate-betamethasone sodium phosphate (CELESTONE) injection 3 mg  3 mg Intramuscular Once Daylene Katayama M, DPM      . betamethasone acetate-betamethasone sodium phosphate (CELESTONE) injection 3 mg  3 mg Intramuscular Once Daylene Katayama M, DPM      . betamethasone acetate-betamethasone sodium phosphate (CELESTONE) injection 3 mg  3 mg Intramuscular Once Daylene Katayama M, DPM      . betamethasone  acetate-betamethasone sodium phosphate (CELESTONE) injection 3 mg  3 mg Intramuscular Once Daylene Katayama M, DPM      . betamethasone acetate-betamethasone sodium phosphate (CELESTONE) injection 3 mg  3 mg Intramuscular Once Edrick Kins, DPM        Allergies  Allergen Reactions  . Aurothioglucose Other (See Comments)  . Fentanyl     Pruritis   . Formaldehyde   . Gold-Containing Drug Products   . Morphine Itching  . Nickel   . Sulfamethoxazole-Trimethoprim     REACTION: Vomit  . Tape Itching    Review of Systems  Constitutional: Negative for activity change,  appetite change, chills, diaphoresis, fatigue and fever.  HENT: Negative for congestion, rhinorrhea, sinus pressure, sinus pain, sneezing, sore throat and trouble swallowing.   Respiratory: Negative for cough, chest tightness, shortness of breath and wheezing.   Cardiovascular: Negative for chest pain.  Gastrointestinal: Negative for abdominal pain, nausea and vomiting.  Genitourinary: Negative for decreased urine volume, difficulty urinating, dysuria, enuresis, flank pain, frequency, genital sores, hematuria, pelvic pain, urgency, vaginal discharge and vaginal pain.       Positive for rectal pain  Musculoskeletal: Negative for myalgias.  Skin: Negative for rash.  Neurological: Negative for headaches.  Psychiatric/Behavioral: Negative for agitation, behavioral problems and confusion.       Objective:    Physical Exam Constitutional:      General: She is not in acute distress.    Appearance: Normal appearance. She is not ill-appearing, toxic-appearing or diaphoretic.  HENT:     Head: Normocephalic and atraumatic.  Neurological:     Mental Status: She is alert.     There were no vitals taken for this visit. Wt Readings from Last 3 Encounters:  01/03/18 176 lb (79.8 kg)  07/16/17 178 lb (80.7 kg)  07/01/17 182 lb 2 oz (82.6 kg)    Health Maintenance Due  Topic Date Due  . Hepatitis C Screening  Never done  .  Zoster Vaccines- Shingrix (1 of 2) Never done  . COVID-19 Vaccine (4 - Booster for Moderna series) 07/15/2020  . PAP SMEAR-Modifier  07/16/2020    There are no preventive care reminders to display for this patient.   Lab Results  Component Value Date   TSH 0.89 01/10/2016   Lab Results  Component Value Date   WBC 4.4 01/10/2016   HGB 13.4 01/10/2016   HCT 38.6 01/10/2016   MCV 91.2 01/10/2016   PLT 215.0 01/10/2016   Lab Results  Component Value Date   NA 142 01/03/2018   K 3.4 (L) 01/03/2018   CO2 29 01/03/2018   GLUCOSE 100 (H) 01/03/2018   BUN 15 01/03/2018   CREATININE 0.64 01/03/2018   BILITOT 1.2 05/21/2017   ALKPHOS 78 05/21/2017   AST 17 05/21/2017   ALT 18 05/21/2017   PROT 7.4 05/21/2017   ALBUMIN 4.4 05/21/2017   CALCIUM 9.2 01/03/2018   GFR 103.12 01/03/2018   Lab Results  Component Value Date   CHOL 190 01/10/2016   Lab Results  Component Value Date   HDL 47.50 01/10/2016   Lab Results  Component Value Date   LDLCALC 122 (H) 01/10/2016   Lab Results  Component Value Date   TRIG 103.0 01/10/2016   Lab Results  Component Value Date   CHOLHDL 4 01/10/2016   Lab Results  Component Value Date   HGBA1C 5.1 01/10/2016       Assessment & Plan:   Problem List Items Addressed This Visit   None   Visit Diagnoses    External hemorrhoids    -  Primary       Meds ordered this encounter  Medications  . hydrocortisone (ANUSOL-HC) 25 MG suppository    Sig: Place 1 suppository (25 mg total) rectally 2 (two) times daily.    Dispense:  12 suppository    Refill:  0    Order Specific Question:   Supervising Provider    Answer:   Waldon Merl [phone]   advised if any worsening, have a face to face visit or  go to the ER.  Advised to schedule appointment to previous  surgeon- whom she has access to and is established with, for further management.  Waldon Merl, PA-C

## 2020-09-13 ENCOUNTER — Ambulatory Visit: Payer: BC Managed Care – PPO | Admitting: General Surgery

## 2020-09-13 ENCOUNTER — Encounter: Payer: Self-pay | Admitting: General Surgery

## 2020-09-13 ENCOUNTER — Encounter: Payer: Self-pay | Admitting: Oncology

## 2020-09-13 ENCOUNTER — Other Ambulatory Visit: Payer: Self-pay

## 2020-09-13 DIAGNOSIS — K645 Perianal venous thrombosis: Secondary | ICD-10-CM | POA: Diagnosis not present

## 2020-09-13 DIAGNOSIS — Z8719 Personal history of other diseases of the digestive system: Secondary | ICD-10-CM | POA: Insufficient documentation

## 2020-09-13 DIAGNOSIS — H9319 Tinnitus, unspecified ear: Secondary | ICD-10-CM | POA: Insufficient documentation

## 2020-09-13 NOTE — Patient Instructions (Signed)
Please call if you have any questions or concerns.  How to Take a [employer] A sitz bath is a warm water bath that may be used to care for your rectum, genital area, or the area between your rectum and genitals (perineum). In a sitz bath, the water only comes up to your hips and covers your buttocks. A sitz bath may be done in a bathtub or with a portable sitz bath that fits over the toilet. Your health care provider may recommend a sitz bath to help:  Relieve pain and discomfort after delivering a baby.  Relieve pain and itching from hemorrhoids or anal fissures.  Relieve pain after certain surgeries.  Relax muscles that are sore or tight. How to take a sitz bath Take 3-4 sitz baths a day, or as many as told by your health care provider. Bathtub sitz bath To take a sitz bath in a bathtub: 1. Partially fill a bathtub with warm water. The water should be deep enough to cover your hips and buttocks when you are sitting in the tub. 2. Follow your health care provider's instructions if you are told to put medicine in the water. 3. Sit in the water. Open the tub drain a little, and leave it open during your bath. 4. Turn on the warm water again, enough to replace the water that is draining out. Keep the water running throughout your bath. This helps keep the water at the right level and temperature. 5. Soak in the water for 15-20 minutes, or as long as told by your health care provider. 6. When you are done, be careful when you stand up. You may feel dizzy. 7. After the sitz bath, pat yourself dry. Do not rub your skin to dry it.   Over-the-toilet sitz bath To take a sitz bath with an over-the-toilet basin: 1. Follow the manufacturer's instructions. 2. Fill the basin with warm water. 3. Follow your health care provider's instructions if you were told to put medicine in the water. 4. Sit on the seat. Make sure the water covers your buttocks and perineum. 5. Soak in the water for 15-20  minutes, or as long as told by your health care provider. 6. After the sitz bath, pat yourself dry. Do not rub your skin to dry it. 7. Clean and dry the basin between uses. 8. Discard the basin if it cracks, or according to the manufacturer's instructions.   Contact a health care provider if:  Your pain or itching gets worse. Do not continue with sitz baths if your symptoms get worse.  You have new symptoms. Do not continue with sitz baths until you talk with your health care provider. Summary  A sitz bath is a warm water bath in which the water only comes up to your hips and covers your buttocks.  A sitz bath may help relieve pain and discomfort after delivering a baby. It also may help with pain and itching from hemorrhoids or anal fissures, or pain after certain surgeries. It can also help to relax muscles that are sore or tight.  Take 3-4 sitz baths a day, or as many as told by your health care provider. Soak in the water for 15-20 minutes.  Do not continue with sitz baths if your symptoms get worse. This information is not intended to replace advice given to you by your health care provider. Make sure you discuss any questions you have with your health care provider. Document Revised: 12/17/2019 Document Reviewed:  12/17/2019 Elsevier Patient Education  2021 [place]. Hemorrhoids Hemorrhoids are swollen veins that may develop:  In the butt (rectum). These are called internal hemorrhoids.  Around the opening of the butt (anus). These are called external hemorrhoids. Hemorrhoids can cause pain, itching, or bleeding. Most of the time, they do not cause serious problems. They usually get better with diet changes, lifestyle changes, and other home treatments. What are the causes? This condition may be caused by:  Having trouble pooping (constipation).  Pushing hard (straining) to poop.  Watery poop (diarrhea).  Pregnancy.  Being very overweight (obese).  Sitting for long  periods of time.  Heavy lifting or other activity that causes you to strain.  Anal sex.  Riding a bike for a long period of time. What are the signs or symptoms? Symptoms of this condition include:  Pain.  Itching or soreness in the butt.  Bleeding from the butt.  Leaking poop.  Swelling in the area.  One or more lumps around the opening of your butt. How is this diagnosed? A doctor can often diagnose this condition by looking at the affected area. The doctor may also:  Do an exam that involves feeling the area with a gloved hand (digital rectal exam).  Examine the area inside your butt using a small tube (anoscope).  Order blood tests. This may be done if you have lost a lot of blood.  Have you get a test that involves looking inside the colon using a flexible tube with a camera on the end (sigmoidoscopy or colonoscopy). How is this treated? This condition can usually be treated at home. Your doctor may tell you to change what you eat, make lifestyle changes, or try home treatments. If these do not help, procedures can be done to remove the hemorrhoids or make them smaller. These may involve:  Placing rubber bands at the base of the hemorrhoids to cut off their blood supply.  Injecting medicine into the hemorrhoids to shrink them.  Shining a type of light energy onto the hemorrhoids to cause them to fall off.  Doing surgery to remove the hemorrhoids or cut off their blood supply. Follow these instructions at home: Eating and drinking  Eat foods that have a lot of fiber in them. These include whole grains, beans, nuts, fruits, and vegetables.  Ask your doctor about taking products that have added fiber (fibersupplements).  Reduce the amount of fat in your diet. You can do this by: ? Eating low-fat dairy products. ? Eating less red meat. ? Avoiding processed foods.  Drink enough fluid to keep your pee (urine) pale yellow.   Managing pain and swelling  Take a  warm-water bath (sitz bath) for 20 minutes to ease pain. Do this 3-4 times a day. You may do this in a bathtub or using a portable sitz bath that fits over the toilet.  If told, put ice on the painful area. It may be helpful to use ice between your warm baths. ? Put ice in a plastic bag. ? Place a towel between your skin and the bag. ? Leave the ice on for 20 minutes, 2-3 times a day.   General instructions  Take over-the-counter and prescription medicines only as told by your doctor. ? Medicated creams and medicines may be used as told.  Exercise often. Ask your doctor how much and what kind of exercise is best for you.  Go to the bathroom when you have the urge to poop. Do not wait.  Avoid pushing too hard when you poop.  Keep your butt dry and clean. Use wet toilet paper or moist towelettes after pooping.  Do not sit on the toilet for a long time.  Keep all follow-up visits as told by your doctor. This is important. Contact a doctor if you:  Have pain and swelling that do not get better with treatment or medicine.  Have trouble pooping.  Cannot poop.  Have pain or swelling outside the area of the hemorrhoids. Get help right away if you have:  Bleeding that will not stop. Summary  Hemorrhoids are swollen veins in the butt or around the opening of the butt.  They can cause pain, itching, or bleeding.  Eat foods that have a lot of fiber in them. These include whole grains, beans, nuts, fruits, and vegetables.  Take a warm-water bath (sitz bath) for 20 minutes to ease pain. Do this 3-4 times a day. This information is not intended to replace advice given to you by your health care provider. Make sure you discuss any questions you have with your health care provider. Document Revised: 04/10/2018 Document Reviewed: 08/22/2017 Elsevier Patient Education  [place].

## 2020-09-13 NOTE — Progress Notes (Signed)
Patient ID: Jessica Spencer, female   DOB: 11/06/1965, 55 y.o.   MRN: [id number]  Chief Complaint  Patient presents with  . New Patient (Initial Visit)    Hemorrhoids     HPI Jessica Spencer is a 55 y.o. female.   She is here today as an urgent self-referral for evaluation of hemorrhoidal disease.  She reports having a long history of hemorrhoids and in the past, has had a thrombosed hemorrhoid that was evacuated by Dr. Pat Patrick.  She reports that last Wednesday, she began having diarrhea and was wiping her bottom frequently as a result.  She tried using over-the-counter agents, but states that these were not helpful in relieving her discomfort.  She had a virtual MyChart visit and hydrocortisone suppositories were prescribed over the holiday weekend.  She says that today, however, the area feels as though it has doubled in size and is quite painful and burning.  She has not noticed any blood in her stools.  She has not been performing sitz baths.   Past Medical History:  Diagnosis Date  . Anxiety   . Bell's palsy   . Constipation    uses miralax PRN only   . Depression   . Dysplastic nevus 07/16/2006   Right sup. medial buttocks. Slight to moderate atypia.   Marland Kitchen Dysplastic nevus 03/16/2013   Right paraspinal mid to low back. Moderate atypia. Limited margins free.  Marland Kitchen Dysplastic nevus 07/16/2019   Mid to upper back. Moderate to severe atypia. Deep margin involved. Excised 09/01/2019, margins free.   Marland Kitchen Hyperlipidemia   . Hypertension   . NASH (nonalcoholic steatohepatitis)    Followed by Dr. Gerald Dexter    Past Surgical History:  Procedure Laterality Date  . ABDOMINAL HYSTERECTOMY    . ABDOMINAL SURGERY     x3  . APPENDECTOMY    . CARPAL TUNNEL RELEASE Right   . carpal tunnel repair     Onset date 03/2008  . COLONOSCOPY  2004, 2016  . ESOPHAGOGASTRODUODENOSCOPY  2004  . small intestine obstruction    . THYROGLOSSAL DUCT CYST  03/2014   Dr. Pryor Ochoa, [facility]    Family History   Problem Relation Age of Onset  . Cancer Father   . Breast cancer Mother 16  . Colon polyps Mother   . Diverticulosis Mother   . Breast cancer Paternal Aunt        1/2  . Colon cancer Neg Hx   . Esophageal cancer Neg Hx   . Rectal cancer Neg Hx   . Stomach cancer Neg Hx     Social History Social History   Tobacco Use  . Smoking status: Never Smoker  . Smokeless tobacco: Never Used  Vaping Use  . Vaping Use: Never used  Substance Use Topics  . Alcohol use: No    Alcohol/week: 0.0 standard drinks  . Drug use: No    Allergies  Allergen Reactions  . Gold Itching  . Other Itching and Rash  . Oxycodone Itching  . Aurothioglucose Other (See Comments)  . Fentanyl     Pruritis   . Formaldehyde   . Gold-Containing Drug Products   . Morphine Itching  . Nickel   . Sulfamethoxazole-Trimethoprim     REACTION: Vomit  . Tape Itching    Current Outpatient Medications  Medication Sig Dispense Refill  . AMBULATORY NON FORMULARY MEDICATION Apply 1 application topically at bedtime. Medication Name: Azelaic Acid 15%, Metronidazole 1%, Ivermectin 1% Cream ([employer]) 30 mg 2  .  Cholecalciferol (VITAMIN D) 2000 units CAPS Take by mouth.    . Cholecalciferol (VITAMIN D3) 1.25 MG (50000 UT) CAPS Vitamin D3    . escitalopram (LEXAPRO) 5 MG tablet TAKE 1 TABLET BY MOUTH EVERY DAY 90 tablet 1  . hydrocortisone (ANUSOL-HC) 25 MG suppository Place 1 suppository (25 mg total) rectally 2 (two) times daily. 12 suppository 0  . mupirocin ointment (BACTROBAN) 2 % Apply 1 application topically daily. With dressing changes 22 g 0  . polyethylene glycol (MIRALAX / GLYCOLAX) 17 g packet Miralax    . pravastatin (PRAVACHOL) 10 MG tablet TAKE 1 TABLET (10 MG TOTAL) BY MOUTH DAILY. 30 tablet 0  . vitamin E 800 UNIT capsule Take 800 Units by mouth daily.     Current Facility-Administered Medications  Medication Dose Route Frequency Provider Last Rate Last Admin  . betamethasone  acetate-betamethasone sodium phosphate (CELESTONE) injection 3 mg  3 mg Intramuscular Once Daylene Katayama M, DPM      . betamethasone acetate-betamethasone sodium phosphate (CELESTONE) injection 3 mg  3 mg Intramuscular Once Daylene Katayama M, DPM      . betamethasone acetate-betamethasone sodium phosphate (CELESTONE) injection 3 mg  3 mg Intramuscular Once Daylene Katayama M, DPM      . betamethasone acetate-betamethasone sodium phosphate (CELESTONE) injection 3 mg  3 mg Intramuscular Once Daylene Katayama M, DPM      . betamethasone acetate-betamethasone sodium phosphate (CELESTONE) injection 3 mg  3 mg Intramuscular Once Edrick Kins, DPM        Review of Systems Review of Systems  Gastrointestinal: Positive for diarrhea and rectal pain.    Blood pressure (!) 138/91, pulse 70, temperature 98.5 F (36.9 C), temperature source Oral, height 5' 6"  (1.676 m), weight 178 lb 6.4 oz (80.9 kg), SpO2 96 %. Body mass index is 28.79 kg/m.  Physical Exam Physical Exam Exam conducted with a chaperone present.  Constitutional:      General: She is not in acute distress.    Appearance: Normal appearance.  HENT:     Head: Normocephalic and atraumatic.     Nose:     Comments: Covered with a mask    Mouth/Throat:     Comments: Covered with a mask Eyes:     General: No scleral icterus.       Right eye: No discharge.        Left eye: No discharge.  Neck:     Comments: No palpable cervical or supraclavicular lymphadenopathy.  The trachea is midline.  No thyromegaly or dominant thyroid masses appreciated.  The gland moves freely with deglutition. Cardiovascular:     Rate and Rhythm: Normal rate and regular rhythm.     Pulses: Normal pulses.  Pulmonary:     Effort: Pulmonary effort is normal.     Breath sounds: Normal breath sounds.  Abdominal:     General: Bowel sounds are normal.     Palpations: Abdomen is soft.  Genitourinary:    Exam position: Knee-chest position.       Comments: Thrombosed  external hemorrhoid present. Musculoskeletal:        General: No deformity or signs of injury.  Skin:    General: Skin is warm and dry.  Neurological:     General: No focal deficit present.     Mental Status: She is alert and oriented to person, place, and time.  Psychiatric:        Mood and Affect: Mood normal.        Behavior:  Behavior normal.     Data Reviewed I reviewed her 2016 colonoscopy which was normal.  Assessment This is a 55 year old woman with an acutely thrombosed external hemorrhoid.  Plan I offered her evacuation in clinic today.  After obtaining consent, 1% lidocaine with epinephrine was infiltrated into the skin and an incision made into the area of thrombosis.  Multiple small clots were evacuated.  A dressing was then applied.  The patient was advised to perform sitz baths 3-4 times daily, as well as after every bowel movement.  She may use over-the-counter analgesics as needed.  I will see her on an as-needed basis.    Fredirick Maudlin 09/13/2020, 3:56 PM

## 2020-09-14 ENCOUNTER — Other Ambulatory Visit: Payer: Self-pay | Admitting: Family

## 2020-09-14 ENCOUNTER — Telehealth: Payer: Self-pay

## 2020-09-14 ENCOUNTER — Encounter: Payer: Self-pay | Admitting: General Surgery

## 2020-09-14 NOTE — Telephone Encounter (Signed)
Patient states she is having pain-she said it was some improvement-instructed to continue with sitz baths-may try OTC counter lidocaine ointment-asked if she wanted something stronger for pain -patient denied- asked about having bleeding told it was normal -if bleeding worsened to call office.

## 2020-09-16 ENCOUNTER — Telehealth: Payer: Self-pay | Admitting: Surgery

## 2020-09-16 ENCOUNTER — Encounter: Payer: Self-pay | Admitting: General Surgery

## 2020-09-26 ENCOUNTER — Telehealth: Payer: Self-pay | Admitting: Surgery

## 2020-10-03 DIAGNOSIS — H2513 Age-related nuclear cataract, bilateral: Secondary | ICD-10-CM | POA: Diagnosis not present

## 2020-10-15 DIAGNOSIS — Z20822 Contact with and (suspected) exposure to covid-19: Secondary | ICD-10-CM | POA: Diagnosis not present

## 2020-10-24 DIAGNOSIS — K760 Fatty (change of) liver, not elsewhere classified: Secondary | ICD-10-CM | POA: Diagnosis not present

## 2020-10-24 DIAGNOSIS — K7581 Nonalcoholic steatohepatitis (NASH): Secondary | ICD-10-CM | POA: Diagnosis not present

## 2020-10-28 ENCOUNTER — Other Ambulatory Visit: Payer: Self-pay | Admitting: Family

## 2020-11-20 ENCOUNTER — Other Ambulatory Visit: Payer: Self-pay | Admitting: Family

## 2020-11-21 ENCOUNTER — Other Ambulatory Visit: Payer: Self-pay

## 2020-11-21 ENCOUNTER — Ambulatory Visit: Payer: BC Managed Care – PPO | Admitting: Dermatology

## 2020-11-21 ENCOUNTER — Ambulatory Visit (INDEPENDENT_AMBULATORY_CARE_PROVIDER_SITE_OTHER): Payer: BC Managed Care – PPO | Admitting: Dermatology

## 2020-11-21 ENCOUNTER — Other Ambulatory Visit: Payer: Self-pay | Admitting: Dermatology

## 2020-11-21 DIAGNOSIS — L821 Other seborrheic keratosis: Secondary | ICD-10-CM

## 2020-11-21 DIAGNOSIS — Z1283 Encounter for screening for malignant neoplasm of skin: Secondary | ICD-10-CM | POA: Diagnosis not present

## 2020-11-21 DIAGNOSIS — L814 Other melanin hyperpigmentation: Secondary | ICD-10-CM

## 2020-11-21 DIAGNOSIS — L578 Other skin changes due to chronic exposure to nonionizing radiation: Secondary | ICD-10-CM

## 2020-11-21 DIAGNOSIS — D2272 Melanocytic nevi of left lower limb, including hip: Secondary | ICD-10-CM | POA: Diagnosis not present

## 2020-11-21 DIAGNOSIS — D239 Other benign neoplasm of skin, unspecified: Secondary | ICD-10-CM

## 2020-11-21 DIAGNOSIS — D229 Melanocytic nevi, unspecified: Secondary | ICD-10-CM

## 2020-11-21 DIAGNOSIS — D2362 Other benign neoplasm of skin of left upper limb, including shoulder: Secondary | ICD-10-CM

## 2020-11-21 DIAGNOSIS — D489 Neoplasm of uncertain behavior, unspecified: Secondary | ICD-10-CM

## 2020-11-21 DIAGNOSIS — D1800 Hemangioma unspecified site: Secondary | ICD-10-CM

## 2020-11-21 DIAGNOSIS — Z86018 Personal history of other benign neoplasm: Secondary | ICD-10-CM

## 2020-11-21 DIAGNOSIS — D1724 Benign lipomatous neoplasm of skin and subcutaneous tissue of left leg: Secondary | ICD-10-CM

## 2020-11-21 NOTE — Progress Notes (Signed)
Follow-Up Visit   Subjective  Jessica Spencer is a 55 y.o. female who presents for the following: TBSE (Patient here for full body skin exam and skin cancer screening. Patient with hx of dysplastic nevi. She is not aware of any new or changing spots. ).  The following portions of the chart were reviewed this encounter and updated as appropriate:   Tobacco  Allergies  Meds  Problems  Med Hx  Surg Hx  Fam Hx     Review of Systems:  No other skin or systemic complaints except as noted in HPI or Assessment and Plan.  Objective  Well appearing patient in no apparent distress; mood and affect are within normal limits.  A full examination was performed including scalp, head, eyes, ears, nose, lips, neck, chest, axillae, abdomen, back, buttocks, bilateral upper extremities, bilateral lower extremities, hands, feet, fingers, toes, fingernails, and toenails. All findings within normal limits unless otherwise noted below.  Left Antecubital Firm pink/brown papulenodule with dimple sign.   Left Thigh Fatty nodule  Left mid sole 0.7 x 0.4cm light brown macule      Assessment & Plan  Dermatofibroma Left Antecubital Benign-appearing.  Observation.  Call clinic for new or changing lesions.  Recommend daily use of broad spectrum spf 30+ sunscreen to sun-exposed areas.   Lipoma of left lower extremity Left Thigh Benign-appearing.  Observation.  Call clinic for new or changing lesions.  Recommend daily use of broad spectrum spf 30+ sunscreen to sun-exposed areas.   Neoplasm of uncertain behavior Left mid sole Epidermal / dermal shaving  Lesion diameter (cm):  0.7 Informed consent: discussed and consent obtained   Timeout: patient name, date of birth, surgical site, and procedure verified   Procedure prep:  Patient was prepped and draped in usual sterile fashion Prep type:  Isopropyl alcohol Anesthesia: the lesion was anesthetized in a standard fashion   Anesthetic:  1%  lidocaine w/ epinephrine 1-100,000 buffered w/ 8.4% NaHCO3 Instrument used: flexible razor blade   Hemostasis achieved with: pressure, aluminum chloride and electrodesiccation   Outcome: patient tolerated procedure well   Post-procedure details: sterile dressing applied and wound care instructions given   Dressing type: bandage and petrolatum    Specimen 1 - Surgical pathology Differential Diagnosis: Nevus vs Dysplastic Nevus  Check Margins: No 0.7 x 0.4cm light brown macule  Skin cancer screening  Lentigines - Scattered tan macules - Due to sun exposure - Benign-appering, observe - Recommend daily broad spectrum sunscreen SPF 30+ to sun-exposed areas, reapply every 2 hours as needed. - Call for any changes  Seborrheic Keratoses - Stuck-on, waxy, tan-brown papules and/or plaques  - Benign-appearing - Discussed benign etiology and prognosis. - Observe - Call for any changes  Melanocytic Nevi - Tan-brown and/or pink-flesh-colored symmetric macules and papules - Benign appearing on exam today - Observation - Call clinic for new or changing moles - Recommend daily use of broad spectrum spf 30+ sunscreen to sun-exposed areas.   Hemangiomas - Red papules - Discussed benign nature - Observe - Call for any changes  Actinic Damage - Chronic condition, secondary to cumulative UV/sun exposure - diffuse scaly erythematous macules with underlying dyspigmentation - Recommend daily broad spectrum sunscreen SPF 30+ to sun-exposed areas, reapply every 2 hours as needed.  - Staying in the shade or wearing long sleeves, sun glasses (UVA+UVB protection) and wide brim hats (4-inch brim around the entire circumference of the hat) are also recommended for sun protection.  - Call for new or  changing lesions.  History of Dysplastic Nevi - No evidence of recurrence today - Recommend regular full body skin exams - Recommend daily broad spectrum sunscreen SPF 30+ to sun-exposed areas, reapply  every 2 hours as needed.  - Call if any new or changing lesions are noted between office visits  Skin cancer screening performed today.  Return in about 1 year (around 11/21/2021) for TBSE.  Graciella Belton, RMA, am acting as scribe for Sarina Ser, MD . Documentation: I have reviewed the above documentation for accuracy and completeness, and I agree with the above.  Sarina Ser, MD

## 2020-11-21 NOTE — Patient Instructions (Addendum)
Wound Care Instructions  Cleanse wound gently with soap and water once a day then pat dry with clean gauze. Apply a thing coat of Petrolatum (petroleum jelly, "Vaseline") over the wound (unless you have an allergy to this). We recommend that you use a new, sterile tube of Vaseline. Do not pick or remove scabs. Do not remove the yellow or white "healing tissue" from the base of the wound.  Cover the wound with fresh, clean, nonstick gauze and secure with paper tape. You may use Band-Aids in place of gauze and tape if the would is small enough, but would recommend trimming much of the tape off as there is often too much. Sometimes Band-Aids can irritate the skin.  You should call the office for your biopsy report after 1 week if you have not already been contacted.  If you experience any problems, such as abnormal amounts of bleeding, swelling, significant bruising, significant pain, or evidence of infection, please call the office immediately.  FOR ADULT SURGERY PATIENTS: If you need something for pain relief you may take 1 extra strength Tylenol (acetaminophen) AND 2 Ibuprofen (249m each) together every 4 hours as needed for pain. (do not take these if you are allergic to them or if you have a reason you should not take them.) Typically, you may only need pain medication for 1 to 3 days.     Melanoma ABCDEs  Melanoma is the most dangerous type of skin cancer, and is the leading cause of death from skin disease.  You are more likely to develop melanoma if you: Have light-colored skin, light-colored eyes, or red or blond hair Spend a lot of time in the sun Tan regularly, either outdoors or in a tanning bed Have had blistering sunburns, especially during childhood Have a close family member who has had a melanoma Have atypical moles or large birthmarks  Early detection of melanoma is key since treatment is typically straightforward and cure rates are extremely high if we catch it early.   The  first sign of melanoma is often a change in a mole or a new dark spot.  The ABCDE system is a way of remembering the signs of melanoma.  A for asymmetry:  The two halves do not match. B for border:  The edges of the growth are irregular. C for color:  A mixture of colors are present instead of an even brown color. D for diameter:  Melanomas are usually (but not always) greater than 673m- the size of a pencil eraser. E for evolution:  The spot keeps changing in size, shape, and color.  Please check your skin once per month between visits. You can use a small mirror in front and a large mirror behind you to keep an eye on the back side or your body.   If you see any new or changing lesions before your next follow-up, please call to schedule a visit.  Please continue daily skin protection including broad spectrum sunscreen SPF 30+ to sun-exposed areas, reapplying every 2 hours as needed when you're outdoors.    If you have any questions or concerns for your doctor, please call our main line at [phone] press option 4 to reach your doctor's medical assistant. If no one answers, please leave a voicemail as directed and we will return your call as soon as possible. Messages left after 4 pm will be answered the following business day.   You may also send [place] message via [place]  typically respond to MyChart messages within 1-2 business days.  For prescription refills, please ask your pharmacy to contact our office. Our fax number is [phone].  If you have an urgent issue when the clinic is closed that cannot wait until the next business day, you can page your doctor at the number below.    Please note that while we do our best to be available for urgent issues outside of office hours, we are not available 24/7.   If you have an urgent issue and are unable to reach [place], you may choose to seek medical care at your doctor's office, retail clinic, urgent care center, or emergency  room.  If you have a medical emergency, please immediately call 911 or go to the emergency department.  Pager Numbers  - Dr. Nehemiah Massed: [phone]  - Dr. Laurence Ferrari: [phone]  - Dr. Nicole Kindred: [phone]  In the event of inclement weather, please call our main line at [phone] for an update on the status of any delays or closures.  Dermatology Medication Tips: Please keep the boxes that topical medications come in in order to help keep track of the instructions about where and how to use these. Pharmacies typically print the medication instructions only on the boxes and not directly on the medication tubes.   If your medication is too expensive, please contact our office at [phone] option 4 or send [place] a message through [place].   We are unable to tell what your co-pay for medications will be in advance as this is different depending on your insurance coverage. However, we may be able to find a substitute medication at lower cost or fill out paperwork to get insurance to cover a needed medication.   If a prior authorization is required to get your medication covered by your insurance company, please allow [place] 1-2 business days to complete this process.  Drug prices often vary depending on where the prescription is filled and some pharmacies may offer cheaper prices.  The website www.goodrx.com contains coupons for medications through different pharmacies. The prices here do not account for what the cost may be with help from insurance (it may be cheaper with your insurance), but the website can give you the price if you did not use any insurance.  - You can print the associated coupon and take it with your prescription to the pharmacy.  - You may also stop by our office during regular business hours and pick up a GoodRx coupon card.  - If you need your prescription sent electronically to a different pharmacy, notify our office through [facility] or by phone at [phone]  option 4.

## 2020-11-22 ENCOUNTER — Encounter: Payer: Self-pay | Admitting: Dermatology

## 2020-11-28 DIAGNOSIS — B880 Other acariasis: Secondary | ICD-10-CM | POA: Diagnosis not present

## 2020-11-28 DIAGNOSIS — Z6830 Body mass index (BMI) 30.0-30.9, adult: Secondary | ICD-10-CM | POA: Diagnosis not present

## 2020-11-30 ENCOUNTER — Telehealth: Payer: Self-pay

## 2020-11-30 NOTE — Telephone Encounter (Signed)
-----   Message from Ralene Bathe, MD sent at 11/28/2020  3:12 PM EDT ----- Diagnosis Skin , left mid sole MELANOCYTIC NEVUS, COMPOUND ACRAL TYPE, BASE INVOLVED  Benign mole No further treatment needed

## 2020-11-30 NOTE — Telephone Encounter (Signed)
Advised pt of bx results/sh ?

## 2021-01-23 ENCOUNTER — Encounter: Payer: Self-pay | Admitting: Family

## 2021-01-23 ENCOUNTER — Encounter: Payer: Self-pay | Admitting: General Surgery

## 2021-01-23 DIAGNOSIS — Z1231 Encounter for screening mammogram for malignant neoplasm of breast: Secondary | ICD-10-CM

## 2021-01-31 DIAGNOSIS — Z23 Encounter for immunization: Secondary | ICD-10-CM | POA: Diagnosis not present

## 2021-02-07 ENCOUNTER — Ambulatory Visit
Admission: RE | Admit: 2021-02-07 | Discharge: 2021-02-07 | Disposition: A | Discharge status: Home / Self Care | Payer: BC Managed Care – PPO | Source: Ambulatory Visit | Attending: Internal Medicine | Admitting: Internal Medicine

## 2021-02-07 ENCOUNTER — Other Ambulatory Visit: Payer: Self-pay

## 2021-02-07 DIAGNOSIS — Z1231 Encounter for screening mammogram for malignant neoplasm of breast: Secondary | ICD-10-CM | POA: Diagnosis not present

## 2021-02-10 ENCOUNTER — Other Ambulatory Visit: Payer: Self-pay | Admitting: Internal Medicine

## 2021-02-10 DIAGNOSIS — R928 Other abnormal and inconclusive findings on diagnostic imaging of breast: Secondary | ICD-10-CM

## 2021-02-10 DIAGNOSIS — N6310 Unspecified lump in the right breast, unspecified quadrant: Secondary | ICD-10-CM

## 2021-02-16 ENCOUNTER — Ambulatory Visit
Admission: RE | Admit: 2021-02-16 | Discharge: 2021-02-16 | Disposition: A | Discharge status: Home / Self Care | Payer: BC Managed Care – PPO | Source: Ambulatory Visit | Attending: Internal Medicine | Admitting: Internal Medicine

## 2021-02-16 ENCOUNTER — Other Ambulatory Visit: Payer: Self-pay

## 2021-02-16 DIAGNOSIS — R928 Other abnormal and inconclusive findings on diagnostic imaging of breast: Secondary | ICD-10-CM | POA: Insufficient documentation

## 2021-02-16 DIAGNOSIS — N6310 Unspecified lump in the right breast, unspecified quadrant: Secondary | ICD-10-CM | POA: Insufficient documentation

## 2021-02-16 DIAGNOSIS — R922 Inconclusive mammogram: Secondary | ICD-10-CM | POA: Diagnosis not present

## 2021-02-22 DIAGNOSIS — E785 Hyperlipidemia, unspecified: Secondary | ICD-10-CM | POA: Diagnosis not present

## 2021-02-22 DIAGNOSIS — K7400 Hepatic fibrosis, unspecified: Secondary | ICD-10-CM | POA: Diagnosis not present

## 2021-02-22 DIAGNOSIS — K7581 Nonalcoholic steatohepatitis (NASH): Secondary | ICD-10-CM | POA: Diagnosis not present

## 2021-02-22 DIAGNOSIS — E559 Vitamin D deficiency, unspecified: Secondary | ICD-10-CM | POA: Diagnosis not present

## 2021-04-25 DIAGNOSIS — K7400 Hepatic fibrosis, unspecified: Secondary | ICD-10-CM | POA: Diagnosis not present

## 2021-04-25 DIAGNOSIS — K7581 Nonalcoholic steatohepatitis (NASH): Secondary | ICD-10-CM | POA: Diagnosis not present

## 2021-05-21 ENCOUNTER — Other Ambulatory Visit: Payer: Self-pay | Admitting: Family

## 2021-08-23 DIAGNOSIS — K7400 Hepatic fibrosis, unspecified: Secondary | ICD-10-CM | POA: Diagnosis not present

## 2021-08-23 DIAGNOSIS — K7581 Nonalcoholic steatohepatitis (NASH): Secondary | ICD-10-CM | POA: Diagnosis not present

## 2021-08-23 DIAGNOSIS — K219 Gastro-esophageal reflux disease without esophagitis: Secondary | ICD-10-CM | POA: Diagnosis not present

## 2021-08-23 DIAGNOSIS — E785 Hyperlipidemia, unspecified: Secondary | ICD-10-CM | POA: Diagnosis not present

## 2021-08-23 DIAGNOSIS — E559 Vitamin D deficiency, unspecified: Secondary | ICD-10-CM | POA: Diagnosis not present

## 2021-10-02 DIAGNOSIS — Z0100 Encounter for examination of eyes and vision without abnormal findings: Secondary | ICD-10-CM | POA: Diagnosis not present

## 2021-10-02 DIAGNOSIS — H2513 Age-related nuclear cataract, bilateral: Secondary | ICD-10-CM | POA: Diagnosis not present

## 2021-10-03 DIAGNOSIS — S39012D Strain of muscle, fascia and tendon of lower back, subsequent encounter: Secondary | ICD-10-CM | POA: Diagnosis not present

## 2021-10-03 DIAGNOSIS — M47816 Spondylosis without myelopathy or radiculopathy, lumbar region: Secondary | ICD-10-CM | POA: Diagnosis not present

## 2021-10-03 DIAGNOSIS — M4696 Unspecified inflammatory spondylopathy, lumbar region: Secondary | ICD-10-CM | POA: Diagnosis not present

## 2021-10-13 DIAGNOSIS — M47816 Spondylosis without myelopathy or radiculopathy, lumbar region: Secondary | ICD-10-CM | POA: Diagnosis not present

## 2021-10-13 DIAGNOSIS — S39012A Strain of muscle, fascia and tendon of lower back, initial encounter: Secondary | ICD-10-CM | POA: Diagnosis not present

## 2021-11-28 ENCOUNTER — Ambulatory Visit: Payer: BC Managed Care – PPO | Admitting: Dermatology

## 2021-11-28 ENCOUNTER — Encounter: Payer: Self-pay | Admitting: Dermatology

## 2021-11-28 DIAGNOSIS — D2361 Other benign neoplasm of skin of right upper limb, including shoulder: Secondary | ICD-10-CM

## 2021-11-28 DIAGNOSIS — L821 Other seborrheic keratosis: Secondary | ICD-10-CM

## 2021-11-28 DIAGNOSIS — Z86018 Personal history of other benign neoplasm: Secondary | ICD-10-CM

## 2021-11-28 DIAGNOSIS — D2362 Other benign neoplasm of skin of left upper limb, including shoulder: Secondary | ICD-10-CM | POA: Diagnosis not present

## 2021-11-28 DIAGNOSIS — L578 Other skin changes due to chronic exposure to nonionizing radiation: Secondary | ICD-10-CM | POA: Diagnosis not present

## 2021-11-28 DIAGNOSIS — L820 Inflamed seborrheic keratosis: Secondary | ICD-10-CM | POA: Diagnosis not present

## 2021-11-28 DIAGNOSIS — D1800 Hemangioma unspecified site: Secondary | ICD-10-CM

## 2021-11-28 DIAGNOSIS — Z1283 Encounter for screening for malignant neoplasm of skin: Secondary | ICD-10-CM | POA: Diagnosis not present

## 2021-11-28 DIAGNOSIS — D229 Melanocytic nevi, unspecified: Secondary | ICD-10-CM

## 2021-11-28 DIAGNOSIS — D239 Other benign neoplasm of skin, unspecified: Secondary | ICD-10-CM

## 2021-11-28 DIAGNOSIS — L814 Other melanin hyperpigmentation: Secondary | ICD-10-CM

## 2021-11-28 NOTE — Progress Notes (Signed)
Follow-Up Visit   Subjective  Jessica Spencer is a 56 y.o. female who presents for the following: Total body skin exam (Hx of Dysplastic Nevi) and check spots (Face, ~60m irritating, pt picks at). The patient presents for Total-Body Skin Exam (TBSE) for skin cancer screening and mole check.  The patient has spots, moles and lesions to be evaluated, some may be new or changing and the patient has concerns that these could be cancer.   The following portions of the chart were reviewed this encounter and updated as appropriate:   Tobacco  Allergies  Meds  Problems  Med Hx  Surg Hx  Fam Hx     Review of Systems:  No other skin or systemic complaints except as noted in HPI or Assessment and Plan.  Objective  Well appearing patient in no apparent distress; mood and affect are within normal limits.  A full examination was performed including scalp, head, eyes, ears, nose, lips, neck, chest, axillae, abdomen, back, buttocks, bilateral upper extremities, bilateral lower extremities, hands, feet, fingers, toes, fingernails, and toenails. All findings within normal limits unless otherwise noted below.  R medial tricep, L antecubital Firm pink/brown papulenodule with dimple sign.   R temple x 1, R forehead x 1, R chin x 1 (3) Stuck on waxy paps with erythema  R upper medial eyelid x 1 Stuck on waxy pap with erythema    Assessment & Plan   Lentigines - Scattered tan macules - Due to sun exposure - Benign-appearing, observe - Recommend daily broad spectrum sunscreen SPF 30+ to sun-exposed areas, reapply every 2 hours as needed. - Call for any changes - upper back  Seborrheic Keratoses - Stuck-on, waxy, tan-brown papules and/or plaques  - Benign-appearing - Discussed benign etiology and prognosis. - Observe - Call for any changes - face, trunk  Melanocytic Nevi - Tan-brown and/or pink-flesh-colored symmetric macules and papules - Benign appearing on exam today -  Observation - Call clinic for new or changing moles - Recommend daily use of broad spectrum spf 30+ sunscreen to sun-exposed areas.  - trunk  Hemangiomas - Red papules - Discussed benign nature - Observe - Call for any changes  Actinic Damage - Chronic condition, secondary to cumulative UV/sun exposure - diffuse scaly erythematous macules with underlying dyspigmentation - Recommend daily broad spectrum sunscreen SPF 30+ to sun-exposed areas, reapply every 2 hours as needed.  - Staying in the shade or wearing long sleeves, sun glasses (UVA+UVB protection) and wide brim hats (4-inch brim around the entire circumference of the hat) are also recommended for sun protection.  - Call for new or changing lesions.  Skin cancer screening performed today.   History of Dysplastic Nevi - No evidence of recurrence today - Recommend regular full body skin exams - Recommend daily broad spectrum sunscreen SPF 30+ to sun-exposed areas, reapply every 2 hours as needed.  - Call if any new or changing lesions are noted between office visits  - R sup medial buttocks, R paraspinal mid to low back, Mid to upper back, L inf lat calf  Dermatofibroma R medial tricep, L antecubital A dermatofibroma is a benign growth possibly related to trauma, such as an insect bite or inflamed acne-type bump.  Discussed removal (shave vrs excision) with resulting scar and risk of recurrence.  Since not bothersome, will observe for now.  Inflamed seborrheic keratosis (3) R temple x 1, R forehead x 1, R chin x 1 Symptomatic, irritating, patient would like treated. Destruction of  lesion - R temple x 1, R forehead x 1, R chin x 1 Complexity: simple   Destruction method: cryotherapy   Informed consent: discussed and consent obtained   Timeout:  patient name, date of birth, surgical site, and procedure verified Lesion destroyed using liquid nitrogen: Yes   Region frozen until ice ball extended beyond lesion: Yes   Outcome:  patient tolerated procedure well with no complications   Post-procedure details: wound care instructions given    Seborrheic keratosis, inflamed R upper medial eyelid margin x 1 Symptomatic, irritating, patient would like treated. Destruction of lesion - R upper medial eyelid margin x 1 Complexity: simple   Destruction method: cryotherapy   Informed consent: discussed and consent obtained   Timeout:  patient name, date of birth, surgical site, and procedure verified Lesion destroyed using liquid nitrogen: Yes   Region frozen until ice ball extended beyond lesion: Yes   Outcome: patient tolerated procedure well with no complications   Post-procedure details: wound care instructions given    Return in about 1 year (around 11/29/2022) for TBSE, Hx of Dysplastic nevi.  I, Othelia Pulling, RMA, am acting as scribe for Sarina Ser, MD . Documentation: I have reviewed the above documentation for accuracy and completeness, and I agree with the above.  Sarina Ser, MD

## 2021-11-28 NOTE — Patient Instructions (Addendum)
Cryotherapy Aftercare  Wash gently with soap and water everyday.   Apply Vaseline and Band-Aid daily until healed.     Due to recent changes in healthcare laws, you may see results of your pathology and/or laboratory studies on MyChart before the doctors have had a chance to review them. We understand that in some cases there may be results that are confusing or concerning to you. Please understand that not all results are received at the same time and often the doctors may need to interpret multiple results in order to provide you with the best plan of care or course of treatment. Therefore, we ask that you please give [place] 2 business days to thoroughly review all your results before contacting the office for clarification. Should we see a critical lab result, you will be contacted sooner.   If You Need Anything After Your Visit  If you have any questions or concerns for your doctor, please call our main line at [phone] and press option 4 to reach your doctor's medical assistant. If no one answers, please leave a voicemail as directed and we will return your call as soon as possible. Messages left after 4 pm will be answered the following business day.   You may also send [place] a message via [place]. We typically respond to MyChart messages within 1-2 business days.  For prescription refills, please ask your pharmacy to contact our office. Our fax number is [phone].  If you have an urgent issue when the clinic is closed that cannot wait until the next business day, you can page your doctor at the number below.    Please note that while we do our best to be available for urgent issues outside of office hours, we are not available 24/7.   If you have an urgent issue and are unable to reach [place], you may choose to seek medical care at your doctor's office, retail clinic, urgent care center, or emergency room.  If you have a medical emergency, please immediately call 911 or go to the  emergency department.  Pager Numbers  - Dr. Nehemiah Massed: [phone]  - Dr. Laurence Ferrari: [phone]  - Dr. Nicole Kindred: [phone]  In the event of inclement weather, please call our main line at [phone] for an update on the status of any delays or closures.  Dermatology Medication Tips: Please keep the boxes that topical medications come in in order to help keep track of the instructions about where and how to use these. Pharmacies typically print the medication instructions only on the boxes and not directly on the medication tubes.   If your medication is too expensive, please contact our office at [phone] option 4 or send [place] a message through [place].   We are unable to tell what your co-pay for medications will be in advance as this is different depending on your insurance coverage. However, we may be able to find a substitute medication at lower cost or fill out paperwork to get insurance to cover a needed medication.   If a prior authorization is required to get your medication covered by your insurance company, please allow [place] 1-2 business days to complete this process.  Drug prices often vary depending on where the prescription is filled and some pharmacies may offer cheaper prices.  The website www.goodrx.com contains coupons for medications through different pharmacies. The prices here do not account for what the cost may be with help from insurance (it may be cheaper with your insurance), but the website can  give you the price if you did not use any insurance.  - You can print the associated coupon and take it with your prescription to the pharmacy.  - You may also stop by our office during regular business hours and pick up a GoodRx coupon card.  - If you need your prescription sent electronically to a different pharmacy, notify our office through [facility] or by phone at [phone] option 4.     Si Usted Necesita Algo Despus de Su Visita  Tambin puede  enviarnos un mensaje a travs de Pharmacist, community. Por lo general respondemos a los mensajes de MyChart en el transcurso de 1 a 2 das hbiles.  Para renovar recetas, por favor pida a su farmacia que se ponga en contacto con nuestra oficina. Harland Dingwall de fax es [place] [phone].  Si tiene un asunto urgente cuando la clnica est cerrada y que no puede esperar hasta el siguiente da hbil, puede llamar/localizar a su doctor(a) al nmero que aparece a continuacin.   Por favor, tenga en cuenta que aunque hacemos todo lo posible para estar disponibles para asuntos urgentes fuera del horario de [place], no estamos disponibles las 24 horas del da, los 7 das de la [employer].   Si tiene un problema urgente y no puede comunicarse con nosotros, puede optar por buscar atencin mdica  en el consultorio de su doctor(a), en una clnica privada, en un centro de atencin urgente o en una sala de emergencias.  Si tiene Engineering geologist, por favor llame inmediatamente al 911 o vaya a la sala de emergencias.  Nmeros de bper  - Dr. Nehemiah Massed: [phone]  - Dra. Moye: [phone]  - Dra. Nicole Kindred: [phone]  En caso de inclemencias del [place], por favor llame a Johnsie Kindred principal al [phone] para una actualizacin sobre el [place] de cualquier retraso o cierre.  Consejos para la medicacin en dermatologa: Por favor, guarde las cajas en las que vienen los medicamentos de uso tpico para ayudarle a seguir las instrucciones sobre dnde y cmo usarlos. Las farmacias generalmente imprimen las instrucciones del medicamento slo en las cajas y no directamente en los tubos del [place].   Si su medicamento es muy caro, por favor, pngase en contacto con Zigmund Daniel llamando al [phone] y presione la opcin 4 o envenos un mensaje a travs de Pharmacist, community.   No podemos decirle cul ser su copago por los medicamentos por adelantado ya que esto es diferente dependiendo de la cobertura de su seguro.  Sin embargo, es posible que podamos encontrar un medicamento sustituto a Electrical engineer un formulario para que el seguro cubra el medicamento que se considera necesario.   Si se requiere una autorizacin previa para que su compaa de seguros [place] su medicamento, por favor permtanos de 1 a 2 das hbiles para completar este proceso.  Los precios de los medicamentos varan con frecuencia dependiendo del Environmental consultant de dnde se surte la receta y alguna farmacias pueden ofrecer precios ms baratos.  El sitio web www.goodrx.com tiene cupones para medicamentos de Airline pilot. Los precios aqu no tienen en cuenta lo que podra costar con la ayuda del seguro (puede ser ms barato con su seguro), pero el sitio web puede darle el precio si no utiliz Research scientist (physical sciences).  - Puede imprimir el cupn correspondiente y llevarlo con su receta a la farmacia.  - Tambin puede pasar por nuestra oficina durante el horario de atencin regular y Charity fundraiser una tarjeta de cupones de GoodRx.  -  Si necesita que su receta se enve electrnicamente a Chiropodist, informe a nuestra oficina a travs de MyChart de [place] o por telfono llamando al [phone] y presione la opcin 4.

## 2021-12-05 ENCOUNTER — Encounter: Payer: Self-pay | Admitting: Dermatology

## 2021-12-26 DIAGNOSIS — K8020 Calculus of gallbladder without cholecystitis without obstruction: Secondary | ICD-10-CM | POA: Diagnosis not present

## 2021-12-26 DIAGNOSIS — K7400 Hepatic fibrosis, unspecified: Secondary | ICD-10-CM | POA: Diagnosis not present

## 2021-12-26 DIAGNOSIS — K760 Fatty (change of) liver, not elsewhere classified: Secondary | ICD-10-CM | POA: Diagnosis not present

## 2021-12-26 DIAGNOSIS — E876 Hypokalemia: Secondary | ICD-10-CM | POA: Diagnosis not present

## 2021-12-26 DIAGNOSIS — K769 Liver disease, unspecified: Secondary | ICD-10-CM | POA: Diagnosis not present

## 2021-12-26 DIAGNOSIS — K7581 Nonalcoholic steatohepatitis (NASH): Secondary | ICD-10-CM | POA: Diagnosis not present

## 2022-02-19 DIAGNOSIS — T63481A Toxic effect of venom of other arthropod, accidental (unintentional), initial encounter: Secondary | ICD-10-CM | POA: Diagnosis not present

## 2022-02-28 DIAGNOSIS — E785 Hyperlipidemia, unspecified: Secondary | ICD-10-CM | POA: Diagnosis not present

## 2022-02-28 DIAGNOSIS — K7400 Hepatic fibrosis, unspecified: Secondary | ICD-10-CM | POA: Diagnosis not present

## 2022-02-28 DIAGNOSIS — K7581 Nonalcoholic steatohepatitis (NASH): Secondary | ICD-10-CM | POA: Diagnosis not present

## 2022-03-06 ENCOUNTER — Encounter: Payer: Self-pay | Admitting: Family

## 2022-03-07 ENCOUNTER — Other Ambulatory Visit: Payer: Self-pay

## 2022-03-07 DIAGNOSIS — Z1231 Encounter for screening mammogram for malignant neoplasm of breast: Secondary | ICD-10-CM

## 2022-05-01 ENCOUNTER — Ambulatory Visit
Admission: RE | Admit: 2022-05-01 | Discharge: 2022-05-01 | Disposition: A | Discharge status: Home / Self Care | Payer: BC Managed Care – PPO | Source: Ambulatory Visit | Attending: Family | Admitting: Family

## 2022-05-01 DIAGNOSIS — Z1231 Encounter for screening mammogram for malignant neoplasm of breast: Secondary | ICD-10-CM | POA: Insufficient documentation

## 2022-06-26 DIAGNOSIS — K7581 Nonalcoholic steatohepatitis (NASH): Secondary | ICD-10-CM | POA: Diagnosis not present

## 2022-06-26 DIAGNOSIS — K760 Fatty (change of) liver, not elsewhere classified: Secondary | ICD-10-CM | POA: Diagnosis not present

## 2022-06-26 DIAGNOSIS — K7400 Hepatic fibrosis, unspecified: Secondary | ICD-10-CM | POA: Diagnosis not present

## 2022-07-17 DIAGNOSIS — E876 Hypokalemia: Secondary | ICD-10-CM | POA: Diagnosis not present

## 2022-07-17 DIAGNOSIS — Z131 Encounter for screening for diabetes mellitus: Secondary | ICD-10-CM | POA: Diagnosis not present

## 2022-07-17 DIAGNOSIS — K7581 Nonalcoholic steatohepatitis (NASH): Secondary | ICD-10-CM | POA: Diagnosis not present

## 2022-07-17 DIAGNOSIS — Z0000 Encounter for general adult medical examination without abnormal findings: Secondary | ICD-10-CM | POA: Diagnosis not present

## 2022-07-17 DIAGNOSIS — E785 Hyperlipidemia, unspecified: Secondary | ICD-10-CM | POA: Diagnosis not present

## 2022-07-17 DIAGNOSIS — E559 Vitamin D deficiency, unspecified: Secondary | ICD-10-CM | POA: Diagnosis not present

## 2022-07-17 DIAGNOSIS — Z0001 Encounter for general adult medical examination with abnormal findings: Secondary | ICD-10-CM | POA: Diagnosis not present

## 2022-07-17 DIAGNOSIS — Z1389 Encounter for screening for other disorder: Secondary | ICD-10-CM | POA: Diagnosis not present

## 2022-08-03 DIAGNOSIS — E876 Hypokalemia: Secondary | ICD-10-CM | POA: Diagnosis not present

## 2022-09-25 DIAGNOSIS — H04123 Dry eye syndrome of bilateral lacrimal glands: Secondary | ICD-10-CM | POA: Diagnosis not present

## 2022-09-25 DIAGNOSIS — H538 Other visual disturbances: Secondary | ICD-10-CM | POA: Diagnosis not present

## 2022-09-25 DIAGNOSIS — Z0100 Encounter for examination of eyes and vision without abnormal findings: Secondary | ICD-10-CM | POA: Diagnosis not present

## 2022-10-14 DIAGNOSIS — S63632A Sprain of interphalangeal joint of right middle finger, initial encounter: Secondary | ICD-10-CM | POA: Diagnosis not present

## 2022-10-14 DIAGNOSIS — S63634A Sprain of interphalangeal joint of right ring finger, initial encounter: Secondary | ICD-10-CM | POA: Diagnosis not present

## 2022-10-22 DIAGNOSIS — S63432A Traumatic rupture of volar plate of right middle finger at metacarpophalangeal and interphalangeal joint, initial encounter: Secondary | ICD-10-CM | POA: Diagnosis not present

## 2022-11-12 DIAGNOSIS — M25511 Pain in right shoulder: Secondary | ICD-10-CM | POA: Diagnosis not present

## 2022-11-12 DIAGNOSIS — S63439D Traumatic rupture of volar plate of unspecified finger at metacarpophalangeal and interphalangeal joint, subsequent encounter: Secondary | ICD-10-CM | POA: Diagnosis not present

## 2022-11-12 DIAGNOSIS — S46011D Strain of muscle(s) and tendon(s) of the rotator cuff of right shoulder, subsequent encounter: Secondary | ICD-10-CM | POA: Diagnosis not present

## 2022-12-06 DIAGNOSIS — K7581 Nonalcoholic steatohepatitis (NASH): Secondary | ICD-10-CM | POA: Diagnosis not present

## 2022-12-06 DIAGNOSIS — F419 Anxiety disorder, unspecified: Secondary | ICD-10-CM | POA: Diagnosis not present

## 2022-12-06 DIAGNOSIS — E785 Hyperlipidemia, unspecified: Secondary | ICD-10-CM | POA: Diagnosis not present

## 2023-01-03 ENCOUNTER — Encounter: Payer: Self-pay | Admitting: Dermatology

## 2023-01-03 ENCOUNTER — Ambulatory Visit: Payer: BC Managed Care – PPO | Admitting: Dermatology

## 2023-01-03 VITALS — BP 133/77

## 2023-01-03 DIAGNOSIS — L820 Inflamed seborrheic keratosis: Secondary | ICD-10-CM

## 2023-01-03 DIAGNOSIS — W908XXA Exposure to other nonionizing radiation, initial encounter: Secondary | ICD-10-CM | POA: Diagnosis not present

## 2023-01-03 DIAGNOSIS — Z1283 Encounter for screening for malignant neoplasm of skin: Secondary | ICD-10-CM

## 2023-01-03 DIAGNOSIS — L814 Other melanin hyperpigmentation: Secondary | ICD-10-CM

## 2023-01-03 DIAGNOSIS — L578 Other skin changes due to chronic exposure to nonionizing radiation: Secondary | ICD-10-CM

## 2023-01-03 DIAGNOSIS — L821 Other seborrheic keratosis: Secondary | ICD-10-CM

## 2023-01-03 DIAGNOSIS — D229 Melanocytic nevi, unspecified: Secondary | ICD-10-CM

## 2023-01-03 DIAGNOSIS — D1801 Hemangioma of skin and subcutaneous tissue: Secondary | ICD-10-CM

## 2023-01-03 DIAGNOSIS — Z86018 Personal history of other benign neoplasm: Secondary | ICD-10-CM

## 2023-01-03 NOTE — Patient Instructions (Signed)
Cryotherapy Aftercare  Wash gently with soap and water everyday.   Apply Vaseline and Band-Aid daily until healed.    Due to recent changes in healthcare laws, you may see results of your pathology and/or laboratory studies on MyChart before the doctors have had a chance to review them. We understand that in some cases there may be results that are confusing or concerning to you. Please understand that not all results are received at the same time and often the doctors may need to interpret multiple results in order to provide you with the best plan of care or course of treatment. Therefore, we ask that you please give [place] 2 business days to thoroughly review all your results before contacting the office for clarification. Should we see a critical lab result, you will be contacted sooner.   If You Need Anything After Your Visit  If you have any questions or concerns for your doctor, please call our main line at [phone] and press option 4 to reach your doctor's medical assistant. If no one answers, please leave a voicemail as directed and we will return your call as soon as possible. Messages left after 4 pm will be answered the following business day.   You may also send [place] a message via MyChart. We typically respond to MyChart messages within 1-2 business days.  For prescription refills, please ask your pharmacy to contact our office. Our fax number is [phone].  If you have an urgent issue when the clinic is closed that cannot wait until the next business day, you can page your doctor at the number below.    Please note that while we do our best to be available for urgent issues outside of office hours, we are not available 24/7.   If you have an urgent issue and are unable to reach [place], you may choose to seek medical care at your doctor's office, retail clinic, urgent care center, or emergency room.  If you have a medical emergency, please immediately call 911 or go to the emergency  department.  Pager Numbers  - Dr. Gwen Pounds: [phone]  - Dr. Roseanne Reno: [phone]  - Dr. Katrinka Blazing: [phone]   In the event of inclement weather, please call our main line at [phone] for an update on the status of any delays or closures.  Dermatology Medication Tips: Please keep the boxes that topical medications come in in order to help keep track of the instructions about where and how to use these. Pharmacies typically print the medication instructions only on the boxes and not directly on the medication tubes.   If your medication is too expensive, please contact our office at [phone] option 4 or send [place] a message through MyChart.   We are unable to tell what your co-pay for medications will be in advance as this is different depending on your insurance coverage. However, we may be able to find a substitute medication at lower cost or fill out paperwork to get insurance to cover a needed medication.   If a prior authorization is required to get your medication covered by your insurance company, please allow [place] 1-2 business days to complete this process.  Drug prices often vary depending on where the prescription is filled and some pharmacies may offer cheaper prices.  The website www.goodrx.com contains coupons for medications through different pharmacies. The prices here do not account for what the cost may be with help from insurance (it may be cheaper with your insurance), but the website can  give you the price if you did not use any insurance.  - You can print the associated coupon and take it with your prescription to the pharmacy.  - You may also stop by our office during regular business hours and pick up a GoodRx coupon card.  - If you need your prescription sent electronically to a different pharmacy, notify our office through [facility] or by phone at [phone] option 4.     Si Usted Necesita Algo Despus de Su Visita  Tambin puede enviarnos  un mensaje a travs de Clinical cytogeneticist. Por lo general respondemos a los mensajes de MyChart en el transcurso de 1 a 2 das hbiles.  Para renovar recetas, por favor pida a su farmacia que se ponga en contacto con nuestra oficina. Annie Sable de fax es [place] [phone].  Si tiene un asunto urgente cuando la clnica est cerrada y que no puede esperar hasta el siguiente da hbil, puede llamar/localizar a su doctor(a) al nmero que aparece a continuacin.   Por favor, tenga en cuenta que aunque hacemos todo lo posible para estar disponibles para asuntos urgentes fuera del horario de [place], no estamos disponibles las 24 horas del da, los 7 [address]  [address] de la [place].   Si tiene un problema urgente y no puede comunicarse con nosotros, puede optar por buscar atencin mdica  en el consultorio de su doctor(a), en una clnica privada, en un centro de atencin urgente o en una sala de emergencias.  Si tiene Engineer, drilling, por favor llame inmediatamente al 911 o vaya a la sala de emergencias.  Nmeros de bper  - Dr. Gwen Pounds: [phone]  - Dra. Roseanne Reno: [phone]  - Dr. Katrinka Blazing: [phone]   En caso de inclemencias del tiempo, por favor llame a Lacy Duverney principal al [phone] para una actualizacin sobre el [place] de cualquier retraso o cierre.  Consejos para la medicacin en dermatologa: Por favor, guarde las cajas en las que vienen los medicamentos de uso tpico para ayudarle a seguir las instrucciones sobre dnde y cmo usarlos. Las farmacias generalmente imprimen las instrucciones del medicamento slo en las cajas y no directamente en los tubos del [place].   Si su medicamento es muy caro, por favor, pngase en contacto con Rolm Gala llamando al [phone] y presione la opcin 4 o envenos un mensaje a travs de Clinical cytogeneticist.   No podemos decirle cul ser su copago por los medicamentos por adelantado ya que esto es diferente dependiendo de la cobertura de su seguro. Sin  embargo, es posible que podamos encontrar un medicamento sustituto a Audiological scientist un formulario para que el seguro cubra el medicamento que se considera necesario.   Si se requiere una autorizacin previa para que su compaa de seguros [place] su medicamento, por favor permtanos de 1 a 2 das hbiles para completar [address]  Los precios de los medicamentos varan con frecuencia dependiendo del Environmental consultant de dnde se surte la receta y alguna farmacias pueden ofrecer precios ms baratos.  El sitio web www.goodrx.com tiene cupones para medicamentos de Health and safety inspector. Los precios aqu no tienen en cuenta lo que podra costar con la ayuda del seguro (puede ser ms barato con su seguro), pero el sitio web puede darle el precio si no utiliz Tourist information centre manager.  - Puede imprimir el cupn correspondiente y llevarlo con su receta a la farmacia.  - Tambin puede pasar por nuestra oficina durante el horario de atencin regular y Education officer, museum una tarjeta de cupones de GoodRx.  -  Si necesita que su receta se enve electrnicamente a Psychiatrist, informe a nuestra oficina a travs de MyChart de [place] o por telfono llamando al [phone] y presione la opcin 4.

## 2023-01-03 NOTE — Progress Notes (Signed)
   Follow-Up Visit   Subjective  Jessica Spencer is a 57 y.o. female who presents for the following: Skin Cancer Screening and Full Body Skin Exam - History of dysplastic nevi  The patient presents for Total-Body Skin Exam (TBSE) for skin cancer screening and mole check. The patient has spots, moles and lesions to be evaluated, some may be new or changing and the patient may have concern these could be cancer.    The following portions of the chart were reviewed this encounter and updated as appropriate: medications, allergies, medical history  Review of Systems:  No other skin or systemic complaints except as noted in HPI or Assessment and Plan.  Objective  Well appearing patient in no apparent distress; mood and affect are within normal limits.  A full examination was performed including scalp, head, eyes, ears, nose, lips, neck, chest, axillae, abdomen, back, buttocks, bilateral upper extremities, bilateral lower extremities, hands, feet, fingers, toes, fingernails, and toenails. All findings within normal limits unless otherwise noted below.   Relevant physical exam findings are noted in the Assessment and Plan.  Right Upper Eyelid x 1, chest x 9 (10) Erythematous stuck-on, waxy papule or plaque    Assessment & Plan   SKIN CANCER SCREENING PERFORMED TODAY.  ACTINIC DAMAGE - Chronic condition, secondary to cumulative UV/sun exposure - diffuse scaly erythematous macules with underlying dyspigmentation - Recommend daily broad spectrum sunscreen SPF 30+ to sun-exposed areas, reapply every 2 hours as needed.  - Staying in the shade or wearing long sleeves, sun glasses (UVA+UVB protection) and wide brim hats (4-inch brim around the entire circumference of the hat) are also recommended for sun protection.  - Call for new or changing lesions.  LENTIGINES, SEBORRHEIC KERATOSES, HEMANGIOMAS - Benign normal skin lesions - Benign-appearing - Call for any changes  MELANOCYTIC  NEVI - Tan-brown and/or pink-flesh-colored symmetric macules and papules - Benign appearing on exam today - Observation - Call clinic for new or changing moles - Recommend daily use of broad spectrum spf 30+ sunscreen to sun-exposed areas.   History of Dysplastic Nevi - No evidence of recurrence today - Recommend regular full body skin exams - Recommend daily broad spectrum sunscreen SPF 30+ to sun-exposed areas, reapply every 2 hours as needed.  - Call if any new or changing lesions are noted between office visits  Inflamed seborrheic keratosis (10) Right Upper Eyelid x 1, chest x 9  Symptomatic, irritating, patient would like treated.  Benign-appearing.  Call clinic for new or changing lesions.    Destruction of lesion - Right Upper Eyelid x 1, chest x 9 (10) Complexity: simple   Destruction method: cryotherapy   Informed consent: discussed and consent obtained   Timeout:  patient name, date of birth, surgical site, and procedure verified Lesion destroyed using liquid nitrogen: Yes   Region frozen until ice ball extended beyond lesion: Yes   Outcome: patient tolerated procedure well with no complications   Post-procedure details: wound care instructions given     Return in about 1 year (around 01/03/2024) for TBSE.  I, Joanie Coddington, CMA, am acting as scribe for Armida Sans, MD .   Documentation: I have reviewed the above documentation for accuracy and completeness, and I agree with the above.  Armida Sans, MD

## 2023-01-08 ENCOUNTER — Encounter: Payer: Self-pay | Admitting: Dermatology

## 2023-02-05 DIAGNOSIS — Z23 Encounter for immunization: Secondary | ICD-10-CM | POA: Diagnosis not present

## 2023-05-29 IMAGING — MG MM DIGITAL DIAGNOSTIC UNILAT*R* W/ TOMO W/ CAD
4 series · 4 of 12 positions shown · non-contrast
Comparison: Previous exam(s).

CLINICAL DATA: Screening recall for a possible right breast mass.

EXAM:
DIGITAL DIAGNOSTIC UNILATERAL RIGHT MAMMOGRAM WITH TOMOSYNTHESIS AND
CAD; ULTRASOUND RIGHT BREAST LIMITED
TECHNIQUE: Right digital diagnostic mammography and breast tomosynthesis was
performed. The images were evaluated with computer-aided detection.;
Targeted ultrasound examination of the right breast was performed

[R MLO synth-2D]
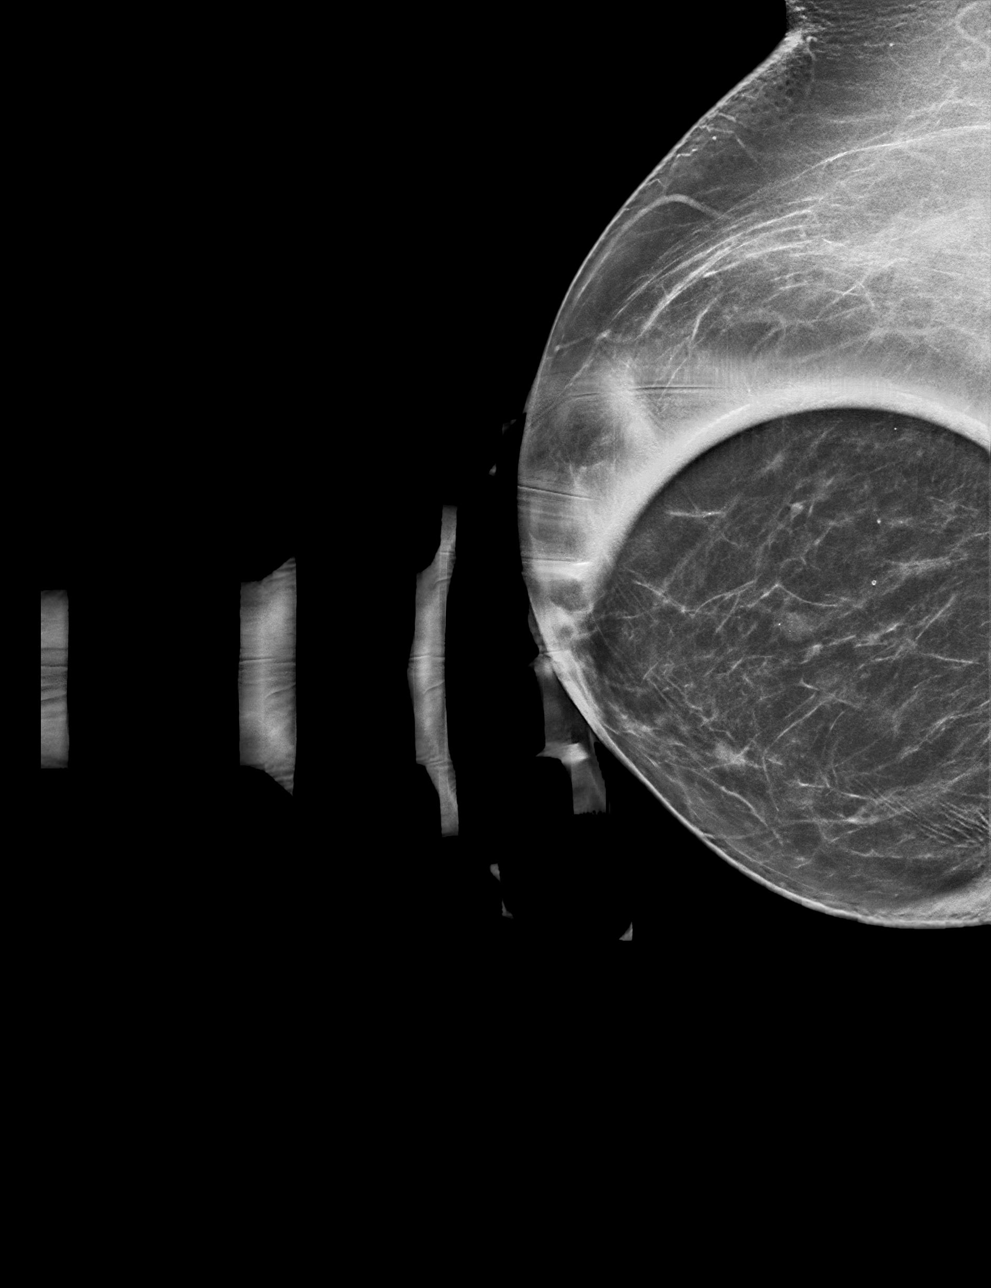

[R CC synth-2D]
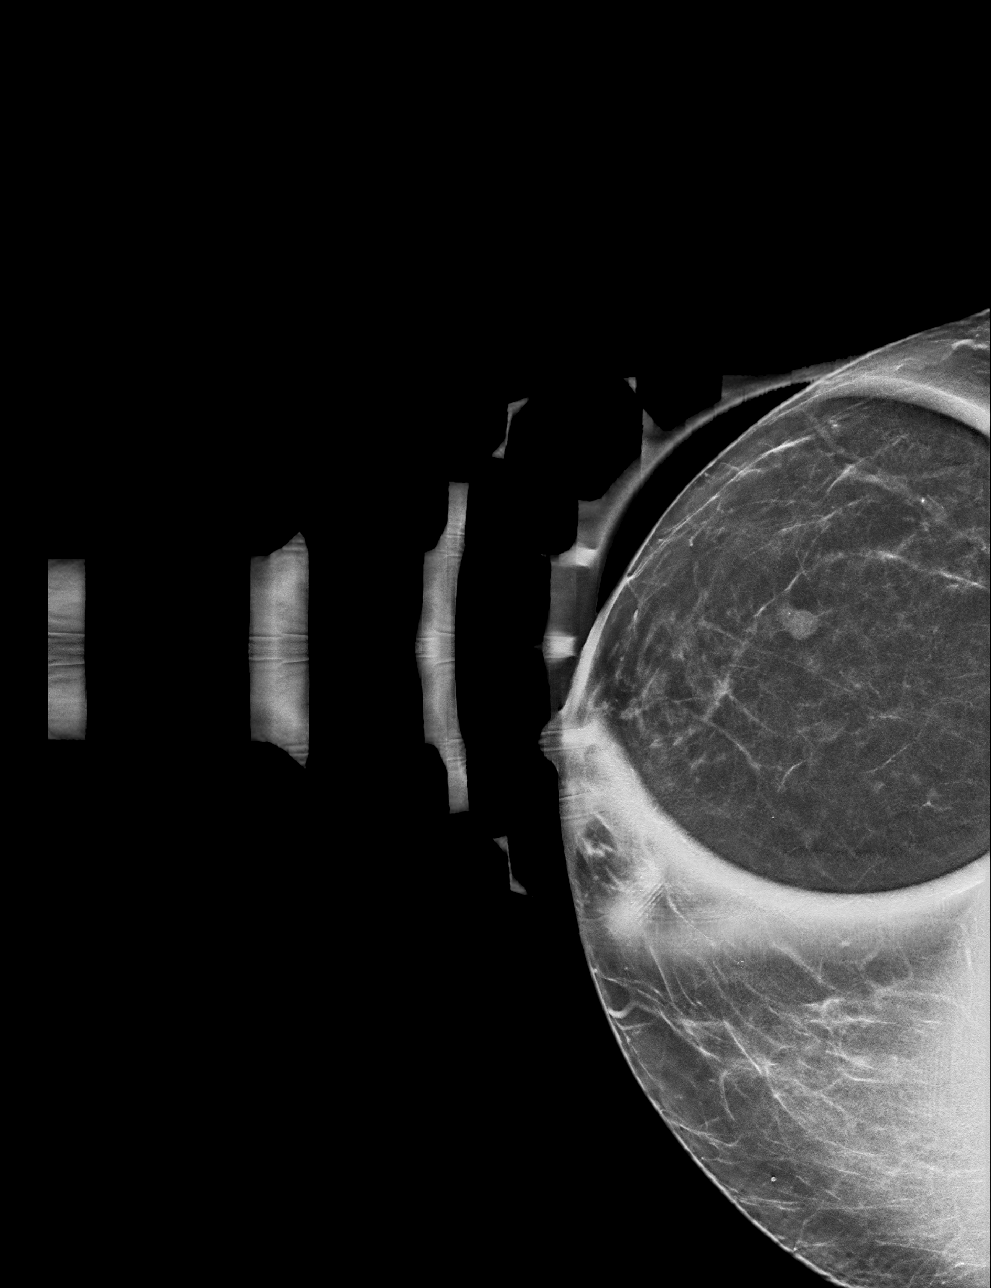

[R CC tomo · tomo slice 25/50.0]
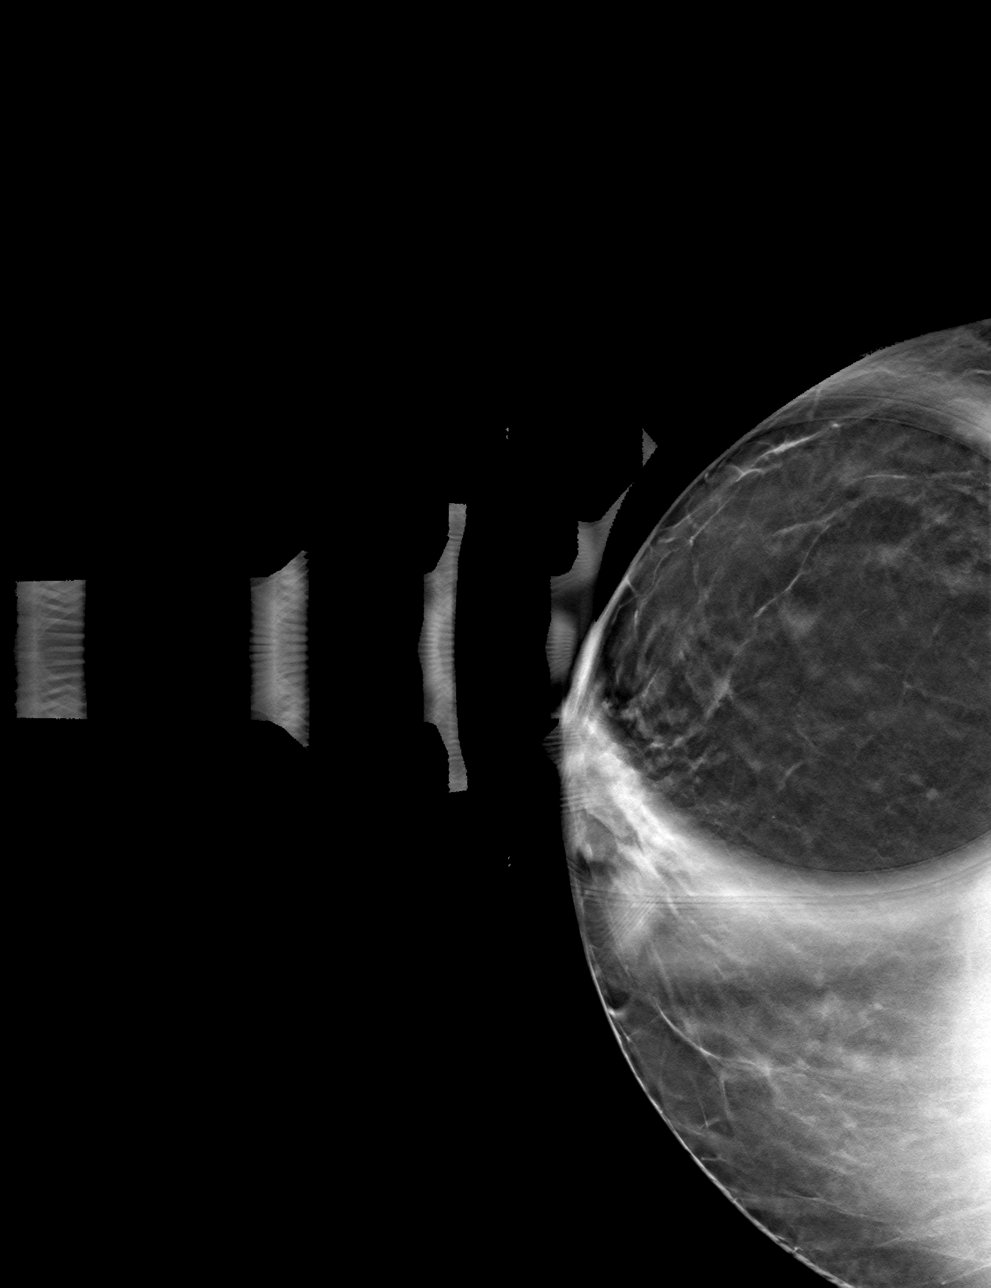

[R MLO tomo · tomo slice 30/59.0]
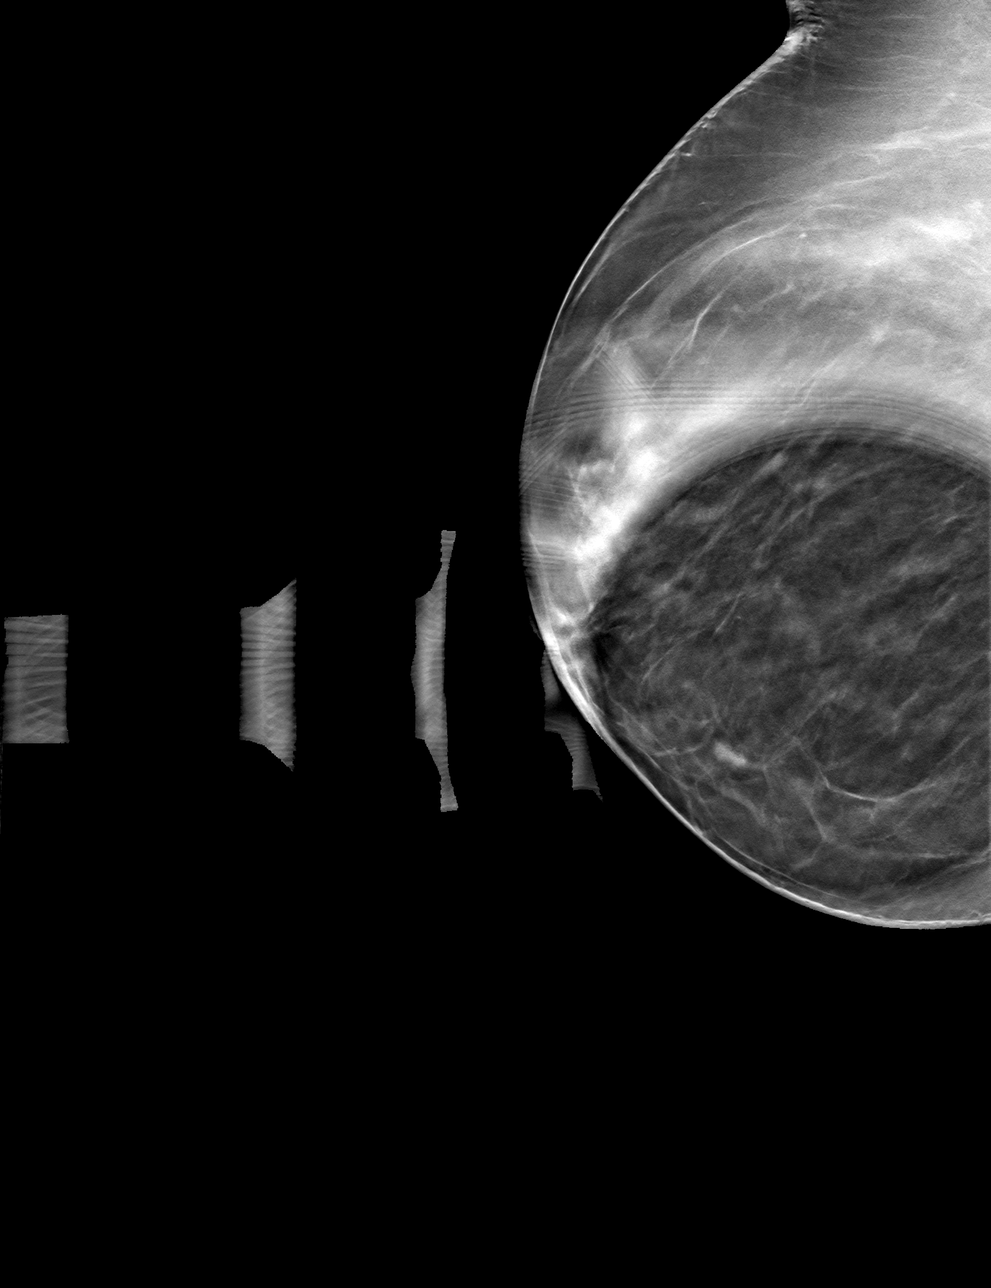

[4 of 12 positions shown; findings below may reference images not displayed]

ACR Breast Density Category b: There are scattered areas of
fibroglandular density.
FINDINGS: An oval, mostly circumscribed mass persists in the lower outer right
breast on the diagnostic spot-compression images, measuring 7 mm in
long axis.

Targeted ultrasound is performed, showing an oval cyst in the right
breast at 8 o'clock, 2 cm the nipple, measuring 7 x 5 x 7 mm,
consistent in size, shape and location to the mammographic mass.
There are no solid masses or suspicious lesions.
IMPRESSION: 1. No evidence of breast malignancy.
2. Benign right breast cyst.

RECOMMENDATION:
Screening mammogram in one year.(Code:[id number])

I have discussed the findings and recommendations with the patient.
If applicable, a reminder letter will be sent to the patient
regarding the next appointment.

BI-RADS CATEGORY  2: Benign.

## 2023-08-19 ENCOUNTER — Other Ambulatory Visit: Payer: Self-pay | Admitting: Family Medicine

## 2023-08-19 DIAGNOSIS — Z1231 Encounter for screening mammogram for malignant neoplasm of breast: Secondary | ICD-10-CM

## 2023-09-03 ENCOUNTER — Ambulatory Visit
Admission: RE | Admit: 2023-09-03 | Discharge: 2023-09-03 | Disposition: A | Discharge status: Home / Self Care | Source: Ambulatory Visit | Attending: Family Medicine | Admitting: Family Medicine

## 2023-09-03 DIAGNOSIS — Z1231 Encounter for screening mammogram for malignant neoplasm of breast: Secondary | ICD-10-CM | POA: Diagnosis present

## 2023-12-02 ENCOUNTER — Ambulatory Visit: Admitting: Dermatology

## 2023-12-02 DIAGNOSIS — D2261 Melanocytic nevi of right upper limb, including shoulder: Secondary | ICD-10-CM

## 2023-12-02 DIAGNOSIS — W908XXA Exposure to other nonionizing radiation, initial encounter: Secondary | ICD-10-CM

## 2023-12-02 DIAGNOSIS — L814 Other melanin hyperpigmentation: Secondary | ICD-10-CM

## 2023-12-02 DIAGNOSIS — D229 Melanocytic nevi, unspecified: Secondary | ICD-10-CM

## 2023-12-02 DIAGNOSIS — Z86018 Personal history of other benign neoplasm: Secondary | ICD-10-CM

## 2023-12-02 DIAGNOSIS — D492 Neoplasm of unspecified behavior of bone, soft tissue, and skin: Secondary | ICD-10-CM

## 2023-12-02 DIAGNOSIS — Z1283 Encounter for screening for malignant neoplasm of skin: Secondary | ICD-10-CM

## 2023-12-02 DIAGNOSIS — D2361 Other benign neoplasm of skin of right upper limb, including shoulder: Secondary | ICD-10-CM | POA: Diagnosis not present

## 2023-12-02 DIAGNOSIS — L578 Other skin changes due to chronic exposure to nonionizing radiation: Secondary | ICD-10-CM | POA: Diagnosis not present

## 2023-12-02 NOTE — Patient Instructions (Addendum)

## 2023-12-02 NOTE — Progress Notes (Unsigned)
 Follow-Up Visit   Subjective  Jessica Spencer is a 58 y.o. female who presents for the following: Skin Cancer Screening and Full Body Skin Exam, hx of Dysplastic nevus   The patient presents for Total-Body Skin Exam (TBSE) for skin cancer screening and mole check. The patient has spots, moles and lesions to be evaluated, some may be new or changing and the patient may have concern these could be cancer.  The following portions of the chart were reviewed this encounter and updated as appropriate: medications, allergies, medical history  Review of Systems:  No other skin or systemic complaints except as noted in HPI or Assessment and Plan.  Objective  Well appearing patient in no apparent distress; mood and affect are within normal limits.  A full examination was performed including scalp, head, eyes, ears, nose, lips, neck, chest, axillae, abdomen, back, buttocks, bilateral upper extremities, bilateral lower extremities, hands, feet, fingers, toes, fingernails, and toenails. All findings within normal limits unless otherwise noted below.   Relevant physical exam findings are noted in the Assessment and Plan.  right proximal lateral tricep 0.7 cm irregular brown macules  right post deltoid 0.7 cm irregular brown macule   Assessment & Plan   SKIN CANCER SCREENING PERFORMED TODAY.  ACTINIC DAMAGE - Chronic condition, secondary to cumulative UV/sun exposure - diffuse scaly erythematous macules with underlying dyspigmentation - Recommend daily broad spectrum sunscreen SPF 30+ to sun-exposed areas, reapply every 2 hours as needed.  - Staying in the shade or wearing long sleeves, sun glasses (UVA+UVB protection) and wide brim hats (4-inch brim around the entire circumference of the hat) are also recommended for sun protection.  - Call for new or changing lesions.  LENTIGINES, SEBORRHEIC KERATOSES, HEMANGIOMAS - Benign normal skin lesions - Benign-appearing - Call for any  changes  MELANOCYTIC NEVI - Tan-brown and/or pink-flesh-colored symmetric macules and papules - Benign appearing on exam today - Observation - Call clinic for new or changing moles - Recommend daily use of broad spectrum spf 30+ sunscreen to sun-exposed areas.   HISTORY OF DYSPLASTIC NEVUS No evidence of recurrence today Recommend regular full body skin exams Recommend daily broad spectrum sunscreen SPF 30+ to sun-exposed areas, reapply every 2 hours as needed.  Call if any new or changing lesions are noted between office visits   NEOPLASM OF SKIN (2) right proximal lateral tricep Epidermal / dermal shaving  Lesion diameter (cm):  0.7 Informed consent: discussed and consent obtained   Timeout: patient name, date of birth, surgical site, and procedure verified   Procedure prep:  Patient was prepped and draped in usual sterile fashion Prep type:  Isopropyl alcohol Anesthesia: the lesion was anesthetized in a standard fashion   Anesthetic:  1% lidocaine w/ epinephrine 1-100,000 buffered w/ 8.4% NaHCO3 Hemostasis achieved with: pressure, aluminum chloride and electrodesiccation   Outcome: patient tolerated procedure well   Post-procedure details: sterile dressing applied and wound care instructions given   Dressing type: bandage and petrolatum    Specimen 1 - Surgical pathology Differential Diagnosis: R/O Dysplastic nevus   Check Margins: No right post deltoid Epidermal / dermal shaving  Lesion diameter (cm):  0.7 Informed consent: discussed and consent obtained   Timeout: patient name, date of birth, surgical site, and procedure verified   Procedure prep:  Patient was prepped and draped in usual sterile fashion Prep type:  Isopropyl alcohol Anesthesia: the lesion was anesthetized in a standard fashion   Anesthetic:  1% lidocaine w/ epinephrine 1-100,000 buffered w/  8.4% NaHCO3 Hemostasis achieved with: pressure, aluminum chloride and electrodesiccation   Outcome: patient  tolerated procedure well   Post-procedure details: sterile dressing applied and wound care instructions given   Dressing type: bandage and petrolatum    Specimen 2 - Surgical pathology Differential Diagnosis: R/O Dysplastic nevus   Check Margins: No    Return in about 1 year (around 12/01/2024) for TBSE, hx of Dysplastic nevus .  IFay Kirks, CMA, am acting as scribe for Alm Rhyme, MD .   Documentation: I have reviewed the above documentation for accuracy and completeness, and I agree with the above.  Alm Rhyme, MD

## 2023-12-03 ENCOUNTER — Encounter: Payer: Self-pay | Admitting: Dermatology

## 2023-12-04 ENCOUNTER — Ambulatory Visit: Payer: Self-pay | Admitting: Dermatology

## 2023-12-04 LAB — SURGICAL PATHOLOGY

## 2023-12-05 ENCOUNTER — Encounter: Payer: Self-pay | Admitting: Dermatology

## 2023-12-05 NOTE — Telephone Encounter (Addendum)
 Called and discussed results with patient. Will recheck area at right proximal lateral tricep at next follow up in November.   Patient scheduled for repeat shave removed October 20 at 3 pm for moderate to severe dysplastic nevus at right posterior deltoid.   ----- Message from Jessica Spencer sent at 12/04/2023  6:05 PM EDT ----- FINAL DIAGNOSIS        1. Skin, right proximal lateral tricep :       DYSPLASTIC NEVUS WITH MODERATE TO SEVERE ATYPIA, CLOSE TO MARGIN, SEE       DESCRIPTION        2. Skin, right post deltoid :       DYSPLASTIC NEVUS WITH MODERATE TO SEVERE ATYPIA, DEEP MARGIN INVOLVED, SEE       DESCRIPTION   1- Moderate to Severe Dysplastic Margins clear, but close to margin May need additional procedure Recheck next visit 2- Moderate to severe dysplastic Needs additional procedure Can do Repeat Shave Removal in 6 -8 weeks OR Excision (may schedule) ----- Message ----- From: Interface, Lab In Three Zero One Sent: 12/04/2023   4:53 PM EDT To: Jessica JAYSON Rhyme, MD

## 2024-01-15 ENCOUNTER — Ambulatory Visit: Payer: BC Managed Care – PPO | Admitting: Dermatology

## 2024-02-03 ENCOUNTER — Ambulatory Visit: Admitting: Dermatology

## 2024-02-03 ENCOUNTER — Encounter: Payer: Self-pay | Admitting: Dermatology

## 2024-02-03 DIAGNOSIS — D239 Other benign neoplasm of skin, unspecified: Secondary | ICD-10-CM

## 2024-02-03 DIAGNOSIS — D2261 Melanocytic nevi of right upper limb, including shoulder: Secondary | ICD-10-CM

## 2024-02-03 NOTE — Progress Notes (Signed)
   Follow-Up Visit   Subjective  Jessica Spencer is a 58 y.o. female who presents for the following: bx proven moderate to severely dysplastic nevus of the R post deltoid, pt here for shave removal.  The following portions of the chart were reviewed this encounter and updated as appropriate: medications, allergies, medical history  Review of Systems:  No other skin or systemic complaints except as noted in HPI or Assessment and Plan.  Objective  Well appearing patient in no apparent distress; mood and affect are within normal limits.   A focused examination was performed of the following areas:   Relevant exam findings are noted in the Assessment and Plan.  R post deltoid 0.7 cm pink bx site.   Assessment & Plan   DYSPLASTIC NEVUS R post deltoid Epidermal / dermal shaving - R post deltoid  Lesion diameter (cm):  1.2 Informed consent: discussed and consent obtained   Timeout: patient name, date of birth, surgical site, and procedure verified   Procedure prep:  Patient was prepped and draped in usual sterile fashion Prep type:  Isopropyl alcohol Anesthesia: the lesion was anesthetized in a standard fashion   Anesthetic:  1% lidocaine w/ epinephrine 1-100,000 buffered w/ 8.4% NaHCO3 Instrument used: flexible razor blade   Hemostasis achieved with: pressure, aluminum chloride and electrodesiccation   Outcome: patient tolerated procedure well   Post-procedure details: sterile dressing applied and wound care instructions given   Dressing type: bandage (Mupirocin  2% ointment)    Specimen 1 - Surgical pathology Differential Diagnosis: Bx proven dysplastic nevus [id number] Check Margins: Yes Related Medications mupirocin  ointment (BACTROBAN ) 2 % Apply 1 application topically daily. With dressing changes  Return for appointment as scheduled.  [name], CMA, am acting as scribe for Alm Rhyme, MD .   Documentation: I have reviewed the above  documentation for accuracy and completeness, and I agree with the above.  Alm Rhyme, MD

## 2024-02-03 NOTE — Patient Instructions (Signed)

## 2024-02-06 LAB — SURGICAL PATHOLOGY

## 2024-02-07 ENCOUNTER — Ambulatory Visit: Payer: Self-pay | Admitting: Dermatology

## 2024-02-10 ENCOUNTER — Encounter: Payer: Self-pay | Admitting: Dermatology

## 2024-02-10 NOTE — Telephone Encounter (Addendum)
 Called and discussed bx results with patient. Patient verbalized understanding and denied further questions. Will recheck at follow up----- Message from Alm Rhyme sent at 02/07/2024  4:37 PM EDT ----- FINAL DIAGNOSIS        1. Skin, right post deltoid :       NO RESIDUAL DYSPLASTIC NEVUS, MARGINS FREE   Site of Previous shave removal of Moderate to Severe Dysplastic Margins free Recheck next visit ----- Message ----- From: Interface, Lab In Three Zero Seven Sent: 02/06/2024   4:32 PM EDT To: Alm JAYSON Rhyme, MD

## 2024-12-01 ENCOUNTER — Ambulatory Visit: Admitting: Dermatology
